# Patient Record
Sex: Male | Born: 1950 | Race: White | Hispanic: No | Smoking: Former smoker
Health system: Southern US, Community
[De-identification: ages and names within clinical notes are randomized; demographics above are authoritative.]

## PROBLEM LIST (undated history)

## (undated) DIAGNOSIS — J449 Chronic obstructive pulmonary disease, unspecified: Secondary | ICD-10-CM

## (undated) DIAGNOSIS — J309 Allergic rhinitis, unspecified: Secondary | ICD-10-CM

## (undated) DIAGNOSIS — J4489 Other specified chronic obstructive pulmonary disease: Secondary | ICD-10-CM

## (undated) DIAGNOSIS — I10 Essential (primary) hypertension: Secondary | ICD-10-CM

## (undated) DIAGNOSIS — K219 Gastro-esophageal reflux disease without esophagitis: Secondary | ICD-10-CM

## (undated) HISTORY — PX: TONSILLECTOMY: SUR1361

## (undated) HISTORY — DX: Gastro-esophageal reflux disease without esophagitis: K21.9

## (undated) HISTORY — DX: Other specified chronic obstructive pulmonary disease: J44.89

## (undated) HISTORY — DX: Allergic rhinitis, unspecified: J30.9

## (undated) HISTORY — DX: Chronic obstructive pulmonary disease, unspecified: J44.9

## (undated) HISTORY — DX: Essential (primary) hypertension: I10

## (undated) HISTORY — PX: BILATERAL VATS ABLATION: SHX1224

## (undated) HISTORY — PX: APPENDECTOMY: SHX54

---

## 2005-05-25 ENCOUNTER — Ambulatory Visit: Payer: Self-pay | Admitting: Internal Medicine

## 2005-06-02 ENCOUNTER — Ambulatory Visit: Payer: Self-pay | Admitting: Internal Medicine

## 2005-06-07 ENCOUNTER — Ambulatory Visit: Payer: Self-pay | Admitting: *Deleted

## 2005-07-24 ENCOUNTER — Ambulatory Visit: Payer: Self-pay | Admitting: Internal Medicine

## 2005-07-25 ENCOUNTER — Ambulatory Visit: Payer: Self-pay | Admitting: Internal Medicine

## 2006-03-13 ENCOUNTER — Ambulatory Visit: Payer: Self-pay | Admitting: Family Medicine

## 2006-03-13 ENCOUNTER — Ambulatory Visit (HOSPITAL_COMMUNITY): Admission: RE | Admit: 2006-03-13 | Discharge: 2006-03-13 | Payer: Self-pay | Admitting: Family Medicine

## 2006-04-09 ENCOUNTER — Ambulatory Visit: Payer: Self-pay | Admitting: Family Medicine

## 2006-04-10 ENCOUNTER — Ambulatory Visit (HOSPITAL_COMMUNITY): Admission: RE | Admit: 2006-04-10 | Discharge: 2006-04-10 | Payer: Self-pay | Admitting: Family Medicine

## 2006-04-11 ENCOUNTER — Ambulatory Visit: Payer: Self-pay | Admitting: Internal Medicine

## 2006-04-23 ENCOUNTER — Ambulatory Visit: Payer: Self-pay | Admitting: Family Medicine

## 2006-04-30 ENCOUNTER — Ambulatory Visit (HOSPITAL_COMMUNITY): Admission: RE | Admit: 2006-04-30 | Discharge: 2006-04-30 | Payer: Self-pay | Admitting: Family Medicine

## 2006-05-16 ENCOUNTER — Ambulatory Visit: Payer: Self-pay | Admitting: Family Medicine

## 2006-06-19 ENCOUNTER — Ambulatory Visit: Admission: RE | Admit: 2006-06-19 | Discharge: 2006-08-30 | Payer: Self-pay | Admitting: Radiation Oncology

## 2006-07-03 ENCOUNTER — Ambulatory Visit: Payer: Self-pay | Admitting: Thoracic Surgery

## 2006-07-03 ENCOUNTER — Encounter (INDEPENDENT_AMBULATORY_CARE_PROVIDER_SITE_OTHER): Payer: Self-pay | Admitting: Specialist

## 2006-07-03 ENCOUNTER — Inpatient Hospital Stay (HOSPITAL_COMMUNITY): Admission: RE | Admit: 2006-07-03 | Discharge: 2006-07-21 | Payer: Self-pay | Admitting: Thoracic Surgery

## 2006-07-03 ENCOUNTER — Ambulatory Visit: Payer: Self-pay | Admitting: Pulmonary Disease

## 2006-07-12 ENCOUNTER — Encounter (INDEPENDENT_AMBULATORY_CARE_PROVIDER_SITE_OTHER): Payer: Self-pay | Admitting: Specialist

## 2006-07-25 ENCOUNTER — Encounter: Admission: RE | Admit: 2006-07-25 | Discharge: 2006-07-25 | Payer: Self-pay | Admitting: Thoracic Surgery

## 2006-07-25 ENCOUNTER — Ambulatory Visit: Payer: Self-pay | Admitting: Thoracic Surgery

## 2006-07-26 ENCOUNTER — Ambulatory Visit: Payer: Self-pay | Admitting: Family Medicine

## 2006-08-01 ENCOUNTER — Encounter: Admission: RE | Admit: 2006-08-01 | Discharge: 2006-08-01 | Payer: Self-pay | Admitting: Thoracic Surgery

## 2006-08-01 ENCOUNTER — Ambulatory Visit: Payer: Self-pay | Admitting: Thoracic Surgery

## 2006-08-15 ENCOUNTER — Encounter: Admission: RE | Admit: 2006-08-15 | Discharge: 2006-08-15 | Payer: Self-pay | Admitting: Thoracic Surgery

## 2006-08-23 ENCOUNTER — Ambulatory Visit: Payer: Self-pay | Admitting: Family Medicine

## 2006-08-29 ENCOUNTER — Encounter: Admission: RE | Admit: 2006-08-29 | Discharge: 2006-08-29 | Payer: Self-pay | Admitting: Thoracic Surgery

## 2006-08-29 ENCOUNTER — Ambulatory Visit: Payer: Self-pay | Admitting: Thoracic Surgery

## 2006-10-11 ENCOUNTER — Ambulatory Visit: Payer: Self-pay | Admitting: Family Medicine

## 2006-10-17 ENCOUNTER — Ambulatory Visit: Payer: Self-pay | Admitting: Thoracic Surgery

## 2006-10-17 ENCOUNTER — Encounter: Admission: RE | Admit: 2006-10-17 | Discharge: 2006-10-17 | Payer: Self-pay | Admitting: Thoracic Surgery

## 2006-11-20 ENCOUNTER — Ambulatory Visit: Payer: Self-pay | Admitting: Thoracic Surgery

## 2006-11-20 ENCOUNTER — Encounter: Admission: RE | Admit: 2006-11-20 | Discharge: 2006-11-20 | Payer: Self-pay | Admitting: Thoracic Surgery

## 2007-01-01 ENCOUNTER — Ambulatory Visit: Payer: Self-pay | Admitting: Thoracic Surgery

## 2007-01-01 ENCOUNTER — Encounter: Admission: RE | Admit: 2007-01-01 | Discharge: 2007-01-01 | Payer: Self-pay | Admitting: Thoracic Surgery

## 2007-02-13 ENCOUNTER — Encounter (INDEPENDENT_AMBULATORY_CARE_PROVIDER_SITE_OTHER): Payer: Self-pay | Admitting: *Deleted

## 2007-02-26 ENCOUNTER — Encounter: Admission: RE | Admit: 2007-02-26 | Discharge: 2007-02-26 | Payer: Self-pay | Admitting: Thoracic Surgery

## 2007-02-26 ENCOUNTER — Ambulatory Visit: Payer: Self-pay | Admitting: Thoracic Surgery

## 2007-03-20 ENCOUNTER — Ambulatory Visit: Payer: Self-pay | Admitting: Family Medicine

## 2007-03-27 ENCOUNTER — Ambulatory Visit (HOSPITAL_COMMUNITY): Admission: RE | Admit: 2007-03-27 | Discharge: 2007-03-27 | Payer: Self-pay | Admitting: Family Medicine

## 2007-04-23 ENCOUNTER — Encounter: Admission: RE | Admit: 2007-04-23 | Discharge: 2007-04-23 | Payer: Self-pay | Admitting: Thoracic Surgery

## 2007-04-23 ENCOUNTER — Ambulatory Visit: Payer: Self-pay | Admitting: Thoracic Surgery

## 2007-05-15 ENCOUNTER — Ambulatory Visit: Payer: Self-pay | Admitting: Critical Care Medicine

## 2007-05-15 DIAGNOSIS — I10 Essential (primary) hypertension: Secondary | ICD-10-CM | POA: Insufficient documentation

## 2007-05-15 DIAGNOSIS — J209 Acute bronchitis, unspecified: Secondary | ICD-10-CM

## 2007-05-15 DIAGNOSIS — K219 Gastro-esophageal reflux disease without esophagitis: Secondary | ICD-10-CM

## 2007-05-15 DIAGNOSIS — J309 Allergic rhinitis, unspecified: Secondary | ICD-10-CM | POA: Insufficient documentation

## 2007-05-15 DIAGNOSIS — G8922 Chronic post-thoracotomy pain: Secondary | ICD-10-CM | POA: Insufficient documentation

## 2007-05-15 DIAGNOSIS — J441 Chronic obstructive pulmonary disease with (acute) exacerbation: Secondary | ICD-10-CM

## 2007-05-22 ENCOUNTER — Encounter: Payer: Self-pay | Admitting: Critical Care Medicine

## 2007-06-03 ENCOUNTER — Telehealth: Payer: Self-pay | Admitting: Critical Care Medicine

## 2007-06-03 ENCOUNTER — Encounter: Payer: Self-pay | Admitting: Critical Care Medicine

## 2007-06-19 ENCOUNTER — Encounter: Payer: Self-pay | Admitting: Critical Care Medicine

## 2007-06-24 ENCOUNTER — Ambulatory Visit: Payer: Self-pay | Admitting: Critical Care Medicine

## 2007-08-08 ENCOUNTER — Ambulatory Visit: Payer: Self-pay | Admitting: Critical Care Medicine

## 2008-04-28 ENCOUNTER — Ambulatory Visit: Payer: Self-pay | Admitting: Critical Care Medicine

## 2008-05-11 ENCOUNTER — Observation Stay (HOSPITAL_COMMUNITY): Admission: EM | Admit: 2008-05-11 | Discharge: 2008-05-12 | Payer: Self-pay | Admitting: Emergency Medicine

## 2008-07-01 ENCOUNTER — Ambulatory Visit: Payer: Self-pay | Admitting: Critical Care Medicine

## 2008-07-01 ENCOUNTER — Encounter: Payer: Self-pay | Admitting: Critical Care Medicine

## 2008-07-01 DIAGNOSIS — R1013 Epigastric pain: Secondary | ICD-10-CM | POA: Insufficient documentation

## 2008-07-02 ENCOUNTER — Ambulatory Visit: Payer: Self-pay | Admitting: Critical Care Medicine

## 2008-07-02 ENCOUNTER — Telehealth: Payer: Self-pay | Admitting: Critical Care Medicine

## 2008-08-03 ENCOUNTER — Ambulatory Visit: Payer: Self-pay | Admitting: Critical Care Medicine

## 2008-08-05 ENCOUNTER — Ambulatory Visit: Payer: Self-pay | Admitting: Cardiovascular Disease

## 2008-08-05 ENCOUNTER — Encounter: Payer: Self-pay | Admitting: Critical Care Medicine

## 2008-08-20 ENCOUNTER — Telehealth (INDEPENDENT_AMBULATORY_CARE_PROVIDER_SITE_OTHER): Payer: Self-pay | Admitting: *Deleted

## 2008-08-31 ENCOUNTER — Encounter: Payer: Self-pay | Admitting: Critical Care Medicine

## 2008-09-16 ENCOUNTER — Ambulatory Visit: Payer: Self-pay | Admitting: Critical Care Medicine

## 2008-12-23 ENCOUNTER — Ambulatory Visit: Payer: Self-pay | Admitting: Critical Care Medicine

## 2009-01-20 IMAGING — CR DG CHEST 2V
2 series · 2 of 2 positions shown · non-contrast
Comparison: 07/18/06.

CLINICAL DATA: 55-year-old with lung lesion.  
 CHEST - 2 VIEW:

[w chest pa]
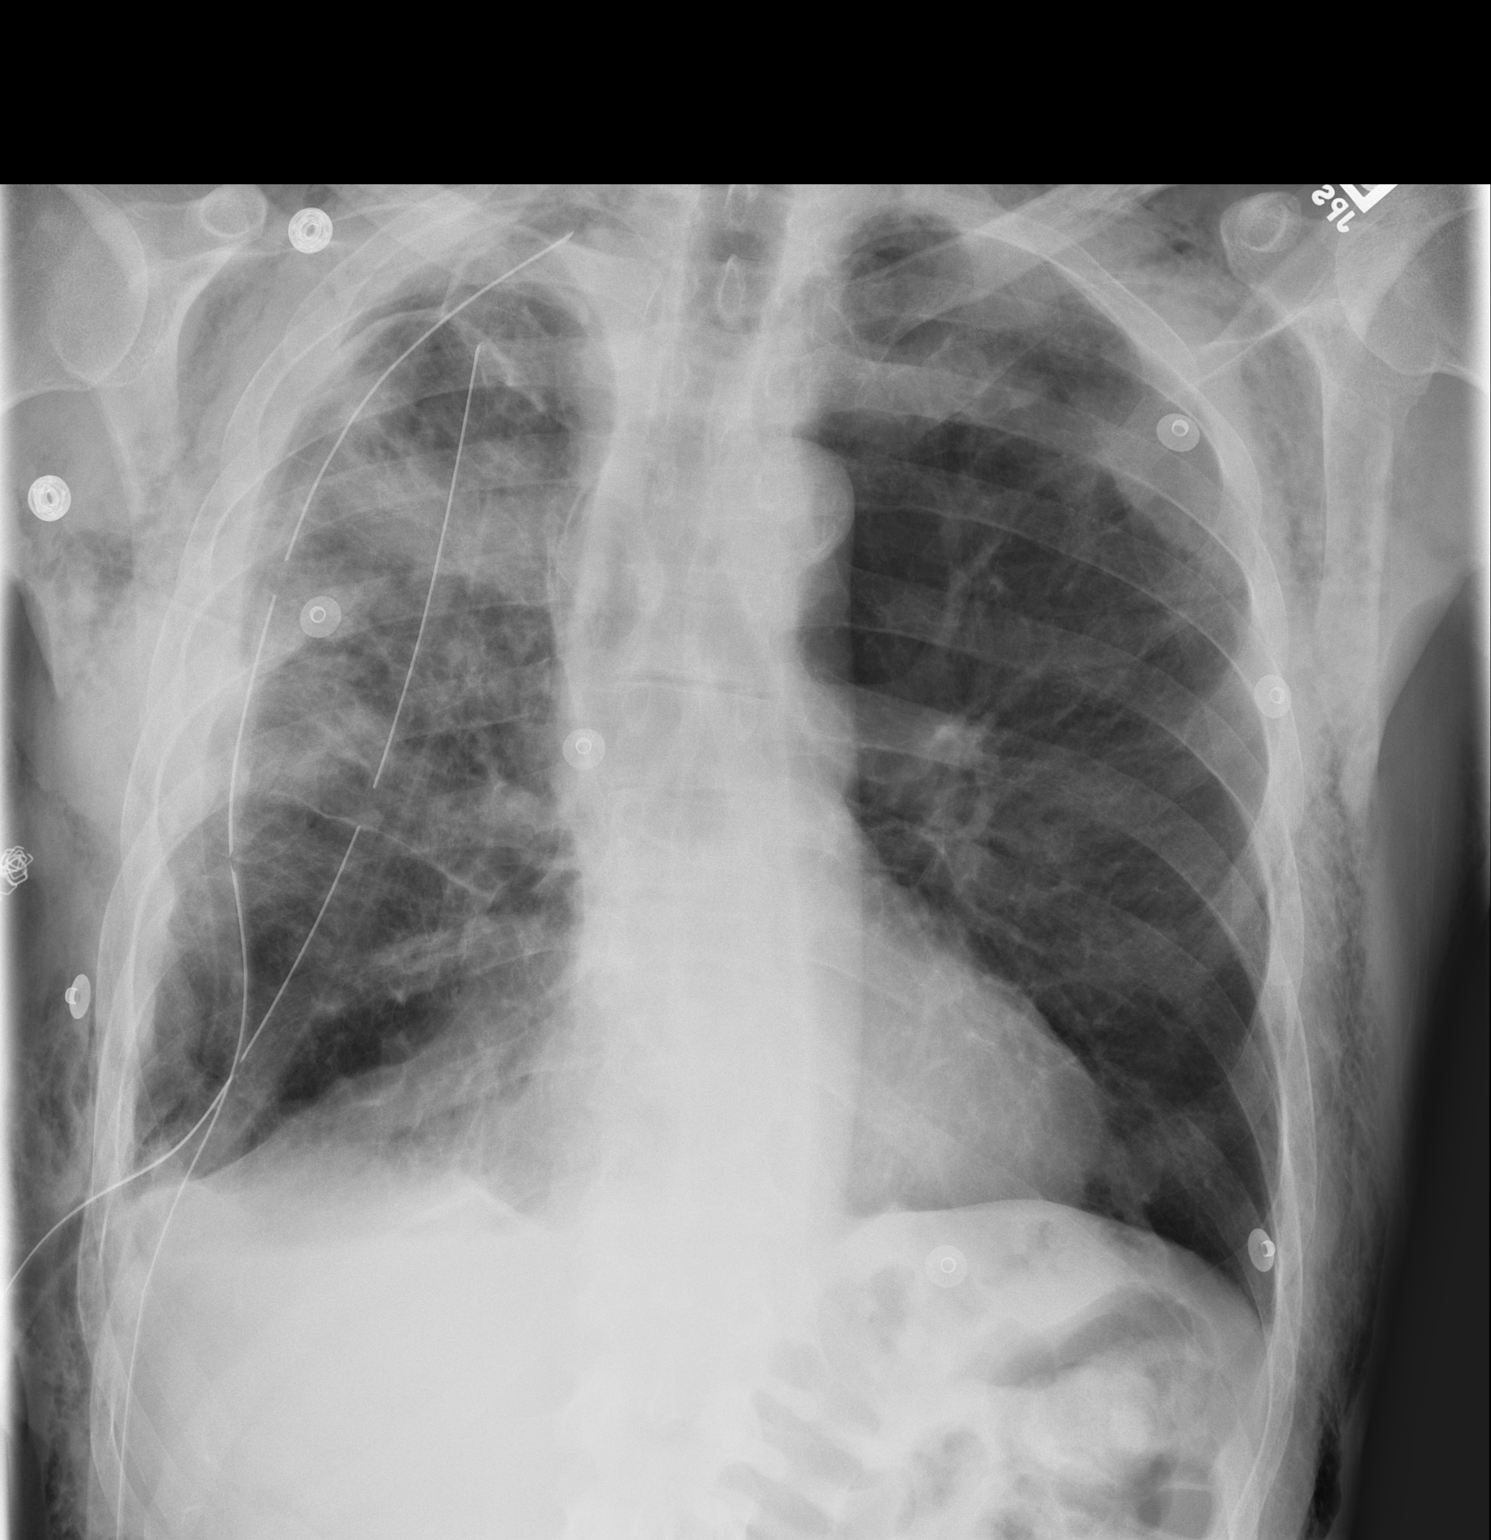

[w chest lat]
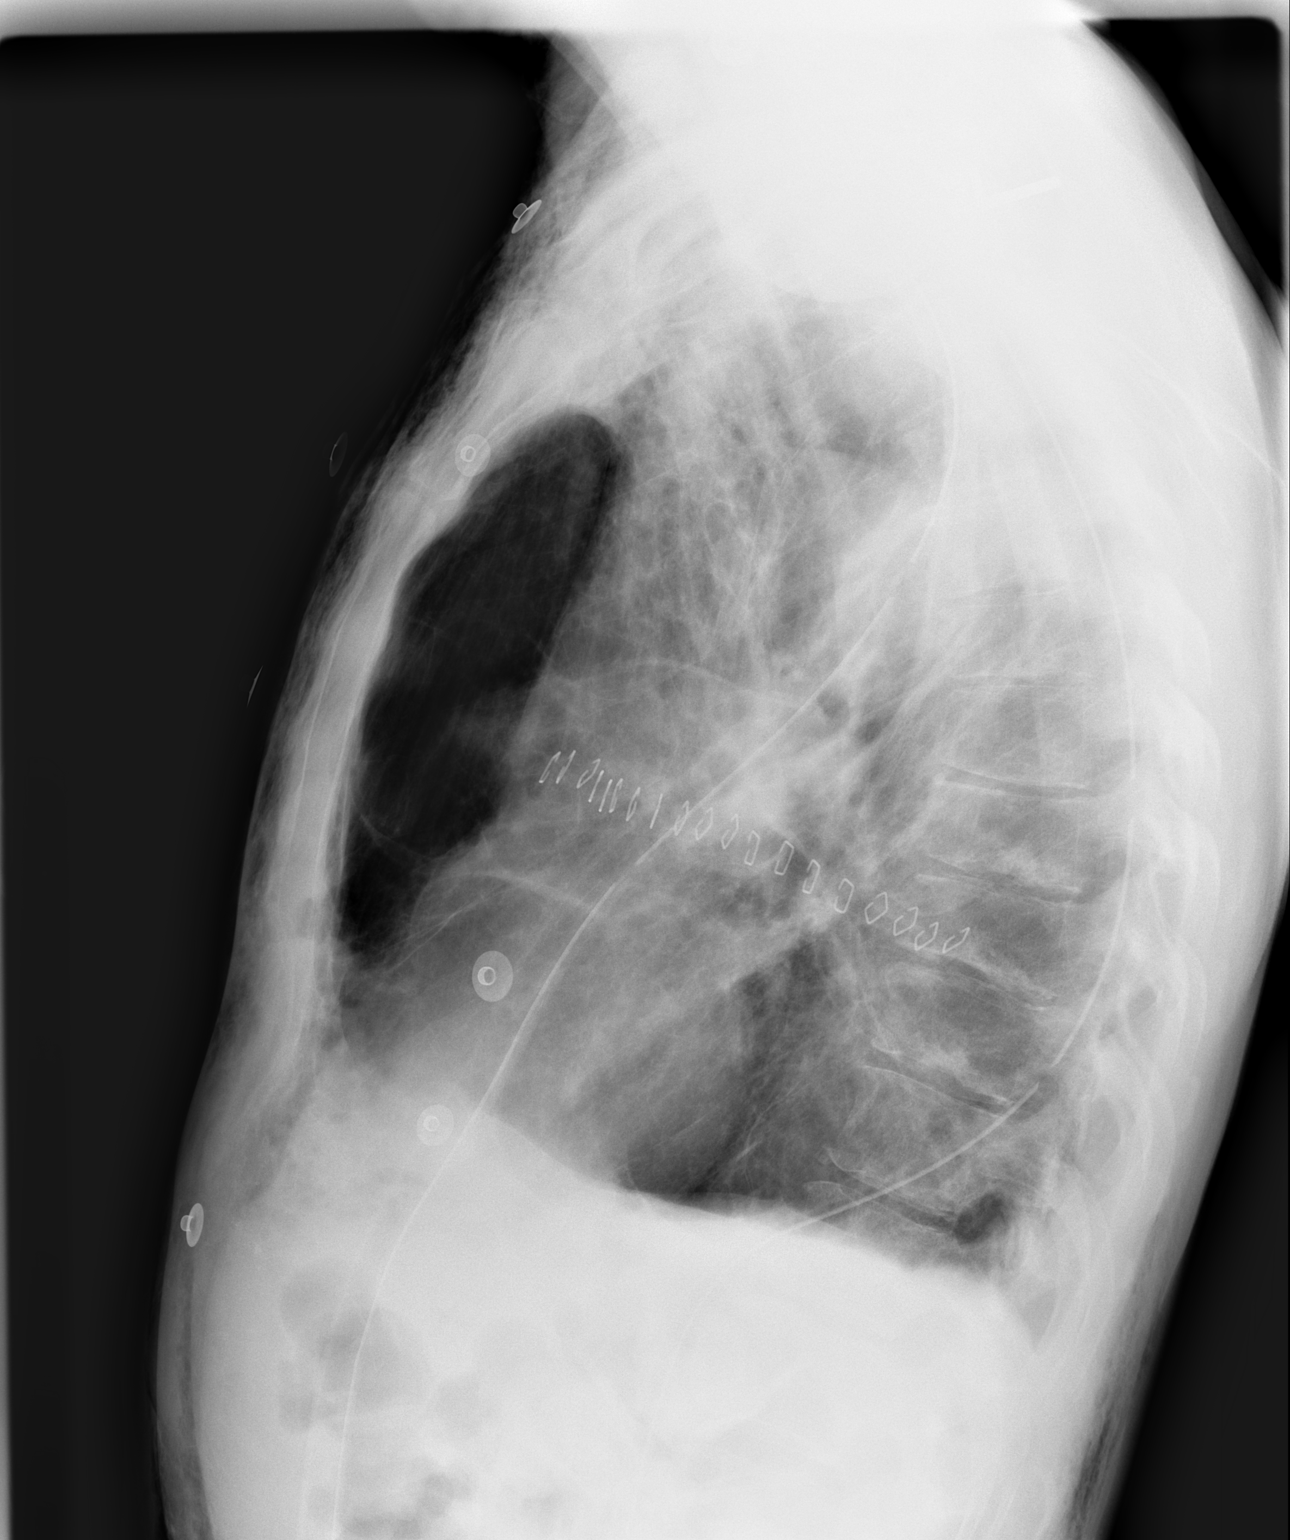

[2 of 2 positions shown; findings below may reference images not displayed]

FINDINGS: The right IJ catheter has been removed.  Two right-sided chest tubes are stable.  There is a small right-sided pneumothorax.  The left lung remains relatively clear.  
 Stable subcutaneous emphysema.  Persistent atelectasis and postoperative changes in the right lung.
IMPRESSION: 1.  Two right-sided chest tubes are stable.  There is a small right-sided pneumothorax.
 2.  Stable subcutaneous emphysema. 
 3.  Removal of right IJ catheter.

## 2009-02-10 ENCOUNTER — Telehealth (INDEPENDENT_AMBULATORY_CARE_PROVIDER_SITE_OTHER): Payer: Self-pay | Admitting: *Deleted

## 2009-02-10 ENCOUNTER — Ambulatory Visit: Payer: Self-pay | Admitting: Critical Care Medicine

## 2009-02-12 ENCOUNTER — Encounter: Payer: Self-pay | Admitting: Critical Care Medicine

## 2009-03-10 ENCOUNTER — Encounter: Payer: Self-pay | Admitting: Critical Care Medicine

## 2009-03-11 ENCOUNTER — Encounter: Payer: Self-pay | Admitting: Critical Care Medicine

## 2009-04-01 ENCOUNTER — Telehealth: Payer: Self-pay | Admitting: Critical Care Medicine

## 2009-04-13 ENCOUNTER — Ambulatory Visit: Payer: Self-pay | Admitting: Critical Care Medicine

## 2009-04-15 ENCOUNTER — Encounter: Payer: Self-pay | Admitting: Critical Care Medicine

## 2009-05-24 IMAGING — CR DG CHEST 2V
2 series · 2 of 2 positions shown · non-contrast
Comparison: 10/17/06.

CLINICAL DATA: Right lung lesion with surgery in June 2006. 
 CHEST - 2 VIEW:

[w chest pa]
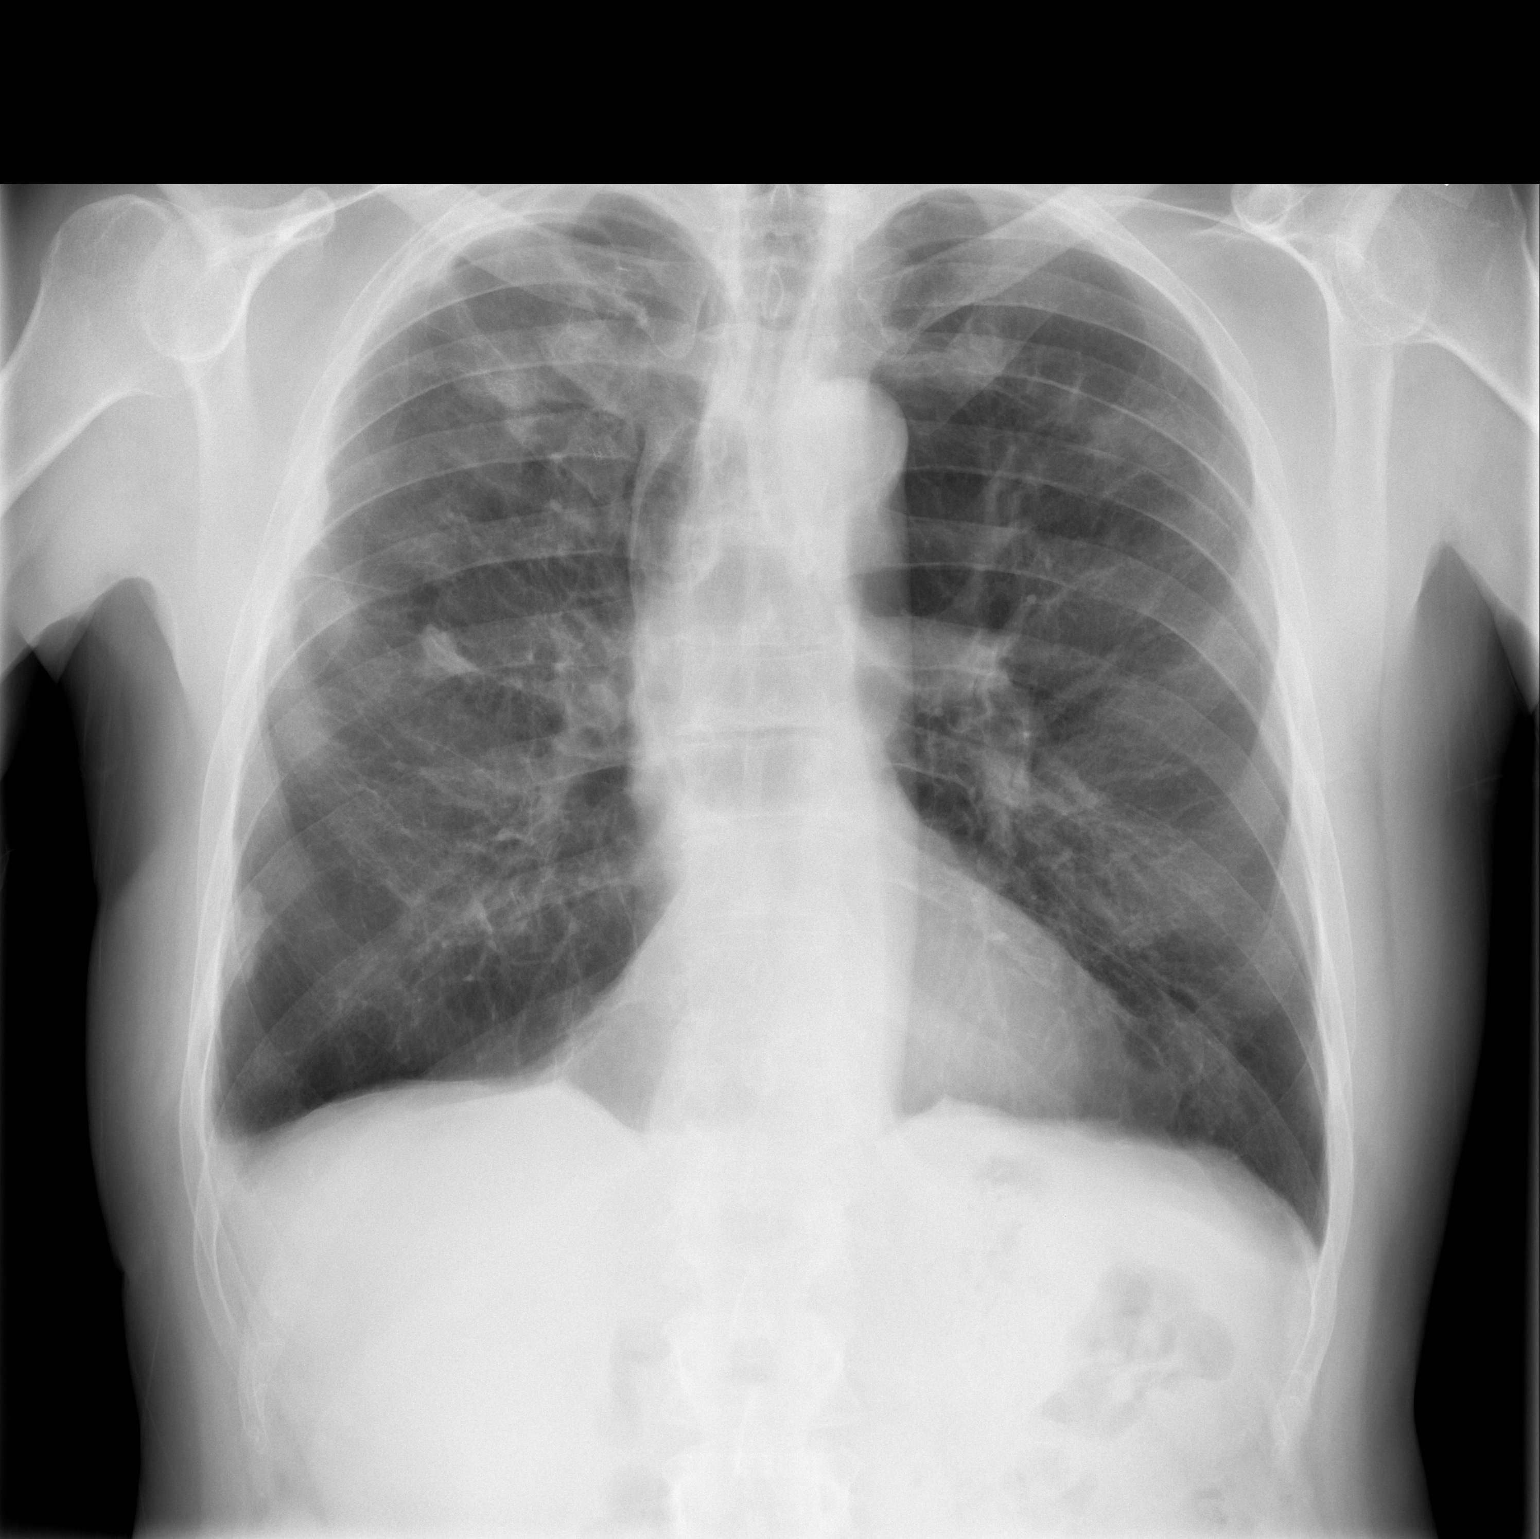

[w chest lat]
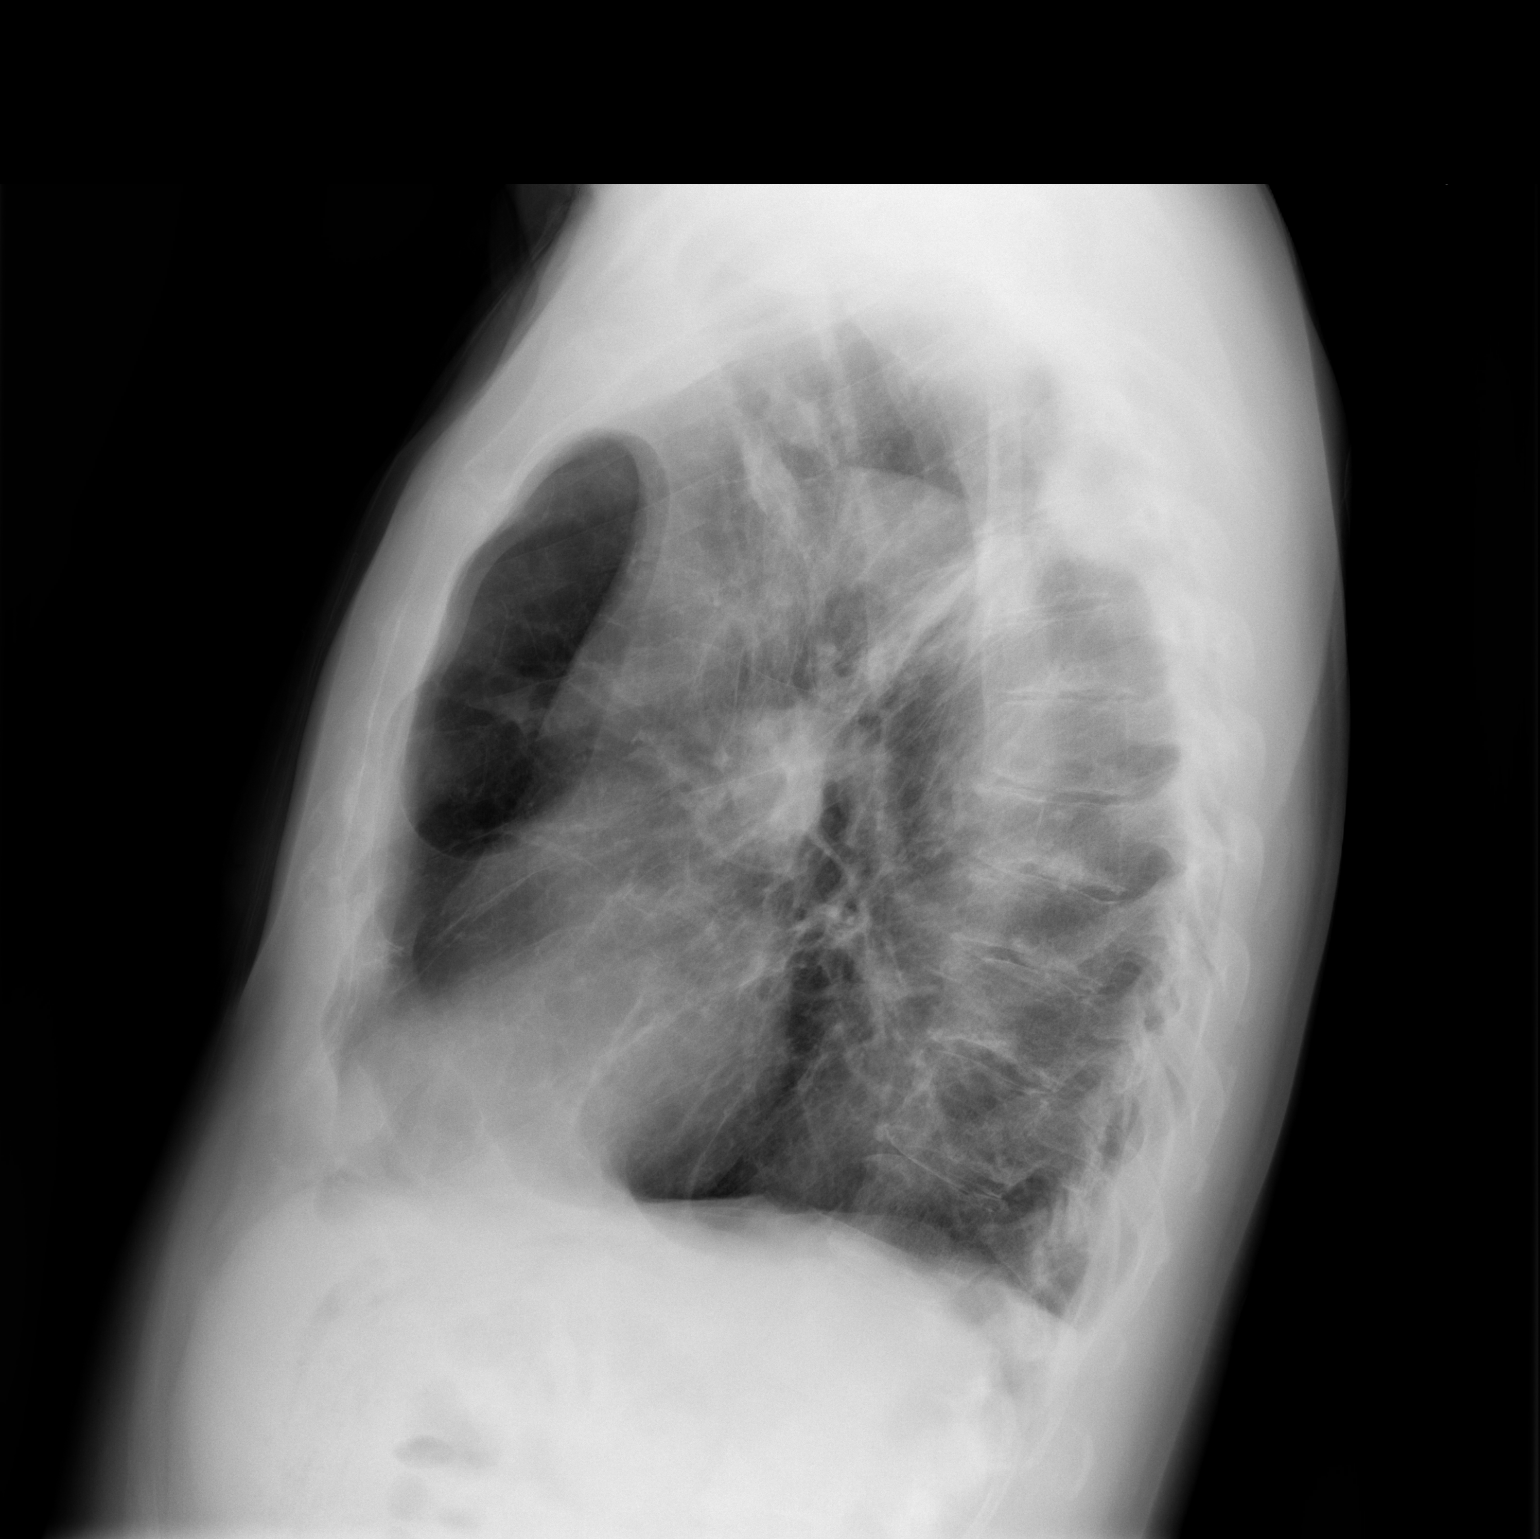

[2 of 2 positions shown; findings below may reference images not displayed]

FINDINGS: Patchy opacities within both lungs are unchanged in appearance compared with the prior film.  A small right hydropneumothorax and anterior mediastinal gas have not significantly changed.
IMPRESSION: No significant change in small right hydropneumothorax and mediastinal gas.

## 2009-05-31 ENCOUNTER — Telehealth: Payer: Self-pay | Admitting: Critical Care Medicine

## 2009-06-01 ENCOUNTER — Ambulatory Visit: Payer: Self-pay | Admitting: Critical Care Medicine

## 2009-06-02 ENCOUNTER — Telehealth (INDEPENDENT_AMBULATORY_CARE_PROVIDER_SITE_OTHER): Payer: Self-pay | Admitting: *Deleted

## 2009-06-22 ENCOUNTER — Encounter: Payer: Self-pay | Admitting: Critical Care Medicine

## 2009-07-19 ENCOUNTER — Ambulatory Visit: Payer: Self-pay | Admitting: Critical Care Medicine

## 2009-07-23 ENCOUNTER — Encounter: Payer: Self-pay | Admitting: Critical Care Medicine

## 2009-11-16 ENCOUNTER — Encounter: Payer: Self-pay | Admitting: Critical Care Medicine

## 2009-12-22 ENCOUNTER — Telehealth: Payer: Self-pay | Admitting: Critical Care Medicine

## 2010-04-13 ENCOUNTER — Ambulatory Visit: Payer: Self-pay | Admitting: Critical Care Medicine

## 2010-06-19 ENCOUNTER — Encounter: Payer: Self-pay | Admitting: Family Medicine

## 2010-06-19 ENCOUNTER — Encounter: Payer: Self-pay | Admitting: Thoracic Surgery

## 2010-06-28 NOTE — Assessment & Plan Note (Signed)
Summary: Pulmonary OV   Primary Provider/Referring Provider:  Ephriam Knuckles [facility], [place]  CC:  1 mo follow up.  states breathing has been a little worse over past month-having increased SOB with activity-using o2 more during the day and at night.  cough-productive with thick green mucus and body aches x2wks.  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     April 13, 2009 11:35 AM Symptoms the same as last ov. The pt notes same amount of heartburn on Dexilant as omeprazole. Pt still with mucous production. rx cefinir per PCP just started 11/11 for 10days pcp rx neurontin 300 three times a day for L arm pain with radiation of pain no pred was given  June 01, 2009 10:18 AM Pt is here for preop clearance for colonoscopy at last ov we rec: Stay on Symbicort and Spiriva Start Qvar 80 two puff twice daily  Prednisone 10mg  4 each am x 4 days, 3 x 4 days, 2 x 4 days, 1 x 4 days then stop Finish Antibiotic from primary care MD Now :  still dyspneic at times, pain in shoulder and burning on R side no real mucous for past few days, did cough up green in the past [facility] MD in [place]>>>>  [phone]   fax [phone] July 19, 2009 9:56 AM Still coughing thick green.  Still with dyspnea.  Had the colonoscopy.  Had hernia on EGD,  colonoscopy.  When swallow will not go down.  Liked nebulizer better.  Pt denies any significant sore throat, nasal congestion or excess secretions, fever, chills, sweats, unintended weight loss, pleurtic or exertional chest pain, orthopnea PND, or leg swelling Pt denies any increase in rescue therapy over baseline, denies waking up needing it or having any early am or nocturnal exacerbations of coughing/wheezing/or dyspnea.   Preventive  Screening-Counseling & Management  Alcohol-Tobacco     Smoking Status: quit > 6 months  Current Medications (verified): 1)  Flonase 50 Mcg/act  Susp (Fluticasone Propionate) .Marland Kitchen.. 1  Sprays Twice Daily Each Nostril 2)  Cozaar 100 Mg Tabs (Losartan Potassium) .... Take 1 Tab Once Daily 3)  Omeprazole 20 Mg  Cpdr (Omeprazole) .Marland Kitchen.. 1 By Mouth Twice A Day. Take One Half Hour Before Eating. 4)  Uroxatral 10 Mg  Tb24 (Alfuzosin Hcl) .Marland Kitchen.. 1 By Mouth Daily 5)  Oxygen .... 2l Bedtime and As Needed 6)  Singulair 10 Mg Tabs (Montelukast Sodium) .Marland Kitchen.. 1 Once Daily 7)  Wellbutrin Sr 150 Mg Xr12h-Tab (Bupropion Hcl) .... 2 Once Daily 8)  Restoril 30 Mg Caps (Temazepam) .Marland Kitchen.. 1 By Mouth At Bedtime 9)  Trazodone Hcl 50 Mg Tabs (Trazodone Hcl) .... At Bedtime 10)  Aerochamber Mv  Misc (Spacer/aero-Holding Chambers) .... Use As Directed 11)  Hydrocodone-Acetaminophen 5-500 Mg Tabs (Hydrocodone-Acetaminophen) .... As Needed 12)  Cyclobenzaprine Hcl 10 Mg Tabs (Cyclobenzaprine Hcl) .... As Needed 13)  Ventolin Hfa 108 (90 Base) Mcg/act Aers (Albuterol Sulfate) .... As Needed 14)  Gabapentin 300 Mg Caps (Gabapentin) .... Three Times A Day 15)  Qvar 80 Mcg/act  Aers (Beclomethasone Dipropionate) .... Two  Puffs Twice Daily 16)  Spiriva Handihaler 18 Mcg  Caps (Tiotropium Bromide Monohydrate) .... Two Puffs in Handihaler Daily 17)  Baclofen 10 Mg Tabs (Baclofen) .... As Directed 18)  Voltaren 1 % Gel (Diclofenac Sodium) .Marland KitchenMarland KitchenMarland Kitchen  As Directed  Allergies (verified): No Known Drug Allergies  Past History:  Past medical, surgical, family and social histories (including risk factors) reviewed, and no changes noted (except as noted below).  Past Medical History: Reviewed history from 07/04/2008 and no changes required. 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 s/p VATS 06/2006  per Dr Edwyna Shell:   Linnell Fulling lung DZ  RUL Appendectomy Tonsillectomy  Family History: Reviewed history from 05/15/2007 and no changes required. dystonia Emphysema:   Social History: Reviewed history from 05/15/2007 and no changes required. 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   Review of Systems       The patient complains of shortness of breath with activity, productive cough, and change in color of mucus.  The patient denies shortness of breath at rest, non-productive cough, coughing up blood, chest pain, irregular heartbeats, acid heartburn, indigestion, loss of appetite, weight change, abdominal pain, difficulty swallowing, sore throat, tooth/dental problems, headaches, nasal congestion/difficulty breathing through nose, sneezing, itching, ear ache, anxiety, depression, hand/feet swelling, joint stiffness or pain, rash, and fever.    Vital Signs:  Patient profile:   60 year old male Height:      69 inches Weight:      195.25 pounds BMI:     28.94 O2 Sat:      96 % on Room air Temp:     97.8 degrees F oral Pulse rate:   89 / minute BP sitting:   150 / 82  (right arm) Cuff size:   regular  Vitals Entered By: Gweneth Dimitri RN (July 19, 2009 9:47 AM)  O2 Flow:  Room air CC: 1 mo follow up.  states breathing has been a little worse over past month-having increased SOB with activity-using o2 more during the day and at night.  cough-productive with thick green mucus and body aches x2wks.  would like to discuss getting nebulizar. Comments Medications reviewed with patient Daytime contact number verified with patient. Gweneth Dimitri RN  July 19, 2009 9:47 AM    Physical Exam  Additional Exam:  Gen: Pleasant, well nourished, in no distress ENT: no lesions, no postnasal drip Neck: No JVD, no TMG, no carotid  bruits Lungs: no use of accessory muscles, no dullness to percussion, distant BS, improved airflow  Cardiovascular: RRR, heart sounds normal, no murmurs or gallops, no peripheral edema Musculoskeletal: No deformities, no cyanosis or clubbing     Impression & Recommendations:  Problem # 1:  EMPHYSEMA (ICD-492.8) Assessment Unchanged COPD moderate with poor DPI technique plan Stop Qvar Start Brovana and budesonide in nebulizer twice daily A nebulizer will be obtained from [facility] Stay on Spiriva daily Return 2 months  Orders: Est. Patient Level IV ([address]) DME Referral (DME)  Medications Added to Medication List This Visit: 1)  Cyclobenzaprine Hcl 10 Mg Tabs (Cyclobenzaprine hcl) .... Hold for now 2)  Brovana 15 Mcg/52ml Nebu (Arformoterol tartrate) .... One in nebulizer twice daily 3)  Baclofen 10 Mg Tabs (Baclofen) .... As directed 4)  Voltaren 1 % Gel (Diclofenac sodium) .... As directed 5)  Budesonide 0.25 Mg/41ml Susp (Budesonide) .... One in nebulizer two times a day 6)  Nebulizer  .... Use with brovana and budesonide  Complete Medication List: 1)  Flonase 50 Mcg/act Susp (Fluticasone propionate) .Marland Kitchen.. 1  sprays twice daily each nostril 2)  Cozaar 100 Mg Tabs (Losartan potassium) .... Take 1 tab once daily 3)  Omeprazole 20 Mg Cpdr (Omeprazole) .Marland Kitchen.. 1 by mouth twice a day. take one half hour before eating. 4)  Uroxatral 10 Mg Tb24 (Alfuzosin hcl) .Marland Kitchen.. 1 by mouth daily 5)  Oxygen  .... 2l bedtime and as needed 6)  Singulair 10 Mg Tabs (Montelukast sodium) .Marland Kitchen.. 1 once daily 7)  Wellbutrin Sr 150 Mg Xr12h-tab (Bupropion hcl) .... 2 once daily 8)  Restoril 30 Mg Caps (Temazepam) .Marland Kitchen.. 1 by mouth at bedtime 9)  Trazodone Hcl 50 Mg Tabs (Trazodone hcl) .... At bedtime 10)  Aerochamber Mv Misc (Spacer/aero-holding chambers) .... Use as directed 11)  Hydrocodone-acetaminophen 5-500 Mg Tabs (Hydrocodone-acetaminophen) .... As needed 12)  Cyclobenzaprine Hcl 10 Mg Tabs  (Cyclobenzaprine hcl) .... Hold for now 13)  Ventolin Hfa 108 (90 Base) Mcg/act Aers (Albuterol sulfate) .... As needed 14)  Gabapentin 300 Mg Caps (Gabapentin) .... Three times a day 15)  Brovana 15 Mcg/4ml Nebu (Arformoterol tartrate) .... One in nebulizer twice daily 16)  Spiriva Handihaler 18 Mcg Caps (Tiotropium bromide monohydrate) .... Two puffs in handihaler daily 17)  Baclofen 10 Mg Tabs (Baclofen) .... As directed 18)  Voltaren 1 % Gel (Diclofenac sodium) .... As directed 19)  Budesonide 0.25 Mg/43ml Susp (Budesonide) .... One in nebulizer two times a day 20)  Nebulizer  .... Use with brovana and budesonide  Patient Instructions: 1)  Stop Qvar 2)  Start Brovana and budesonide in nebulizer twice daily 3)  A nebulizer will be obtained from [facility] 4)  Stay on Spiriva daily 5)  Return 2 months  Prescriptions: NEBULIZER Use with brovana and budesonide  #1 x 0   Entered and Authorized by:   Storm Frisk MD   Signed by:   Storm Frisk MD on 07/19/2009   Method used:   Print then Give to Patient   RxID:   [id number] BUDESONIDE 0.25 MG/2ML SUSP (BUDESONIDE) One in nebulizer two times a day  #60 x 6   Entered and Authorized by:   Storm Frisk MD   Signed by:   Storm Frisk MD on 07/19/2009   Method used:   Print then Give to Patient   RxID:   [id number] BROVANA 15 MCG/2ML  NEBU (ARFORMOTEROL TARTRATE) One in nebulizer twice daily  #60 x 6   Entered and Authorized by:   Storm Frisk MD   Signed by:   Storm Frisk MD on 07/19/2009   Method used:   Print then Give to Patient   RxID:   [id number]    Immunization History:  Influenza Immunization History:    Influenza:  historical (02/26/2009)  Pneumovax Immunization History:    Pneumovax:  historical (05/30/2007)

## 2010-06-28 NOTE — Progress Notes (Signed)
Summary: prescript  Phone Note Call from Patient   Caller: [name] Call For: Nicholas Brady Summary of Call: pt want to go back on qvar also need prescript for ventolin hfa [facility] [phone] Initial call taken by: Rickard Patience,  December 22, 2009 11:03 AM  Follow-up for Phone Call        called and spoke with krista--pts wife---she stated that the pt is requesting to go back on the qvar--he stated that this works better for him than using the nebs--appt has been made for pt for 8-4--at 2pm.  pt also requesting refill of the ventolin.  please advise. thanks Randell Loop CMA  December 22, 2009 11:17 AM   Additional Follow-up for Phone Call Additional follow up Details #1::        this is ok the Qvar should be two puff two times a day ok for refill on ventolin hfa  Additional Follow-up by: Storm Frisk MD,  December 22, 2009 11:19 AM    Additional Follow-up for Phone Call Additional follow up Details #2::    called and spoke with krista---she is aware of meds that have been sent to pts pharmacy--will call for any other concerns. Randell Loop CMA  December 22, 2009 11:25 AM   New/Updated Medications: QVAR 80 MCG/ACT AERS (BECLOMETHASONE DIPROPIONATE) 2 puffs by mouth two times a day Prescriptions: QVAR 80 MCG/ACT AERS (BECLOMETHASONE DIPROPIONATE) 2 puffs by mouth two times a day  #1 x 1   Entered by:   Randell Loop CMA   Authorized by:   Storm Frisk MD   Signed by:   Randell Loop CMA on 12/22/2009   Method used:   Electronically to        [facility]  [address] (retail)       [address]       [place], [place]  [address]       Ph: [phone]       Fax: [phone]   RxID:   [id number] VENTOLIN HFA 108 (90 BASE) MCG/ACT AERS (ALBUTEROL SULFATE) as needed  #1 x 1   Entered by:   Randell Loop CMA   Authorized by:   Storm Frisk MD   Signed by:   Randell Loop CMA on 12/22/2009   Method used:   Electronically to        [facility] [address] (retail)       [address]       [place], [place]  [address]       Ph: [phone]       Fax: [phone]   RxID:   [id number]

## 2010-06-28 NOTE — Progress Notes (Signed)
Summary: talk to nurse  Phone Note Call from Patient Call back at Home Phone [phone]   Caller: Patient Call For: wright Reason for Call: Talk to Nurse Summary of Call: patient sceduled an apt to see dr Delford Field in feb. and he is also scheduled for a colonoscopy, dr Delford Field will need him to approve him for all of this. he will be under ansethia.  Initial call taken by: Valinda Hoar,  May 31, 2009 12:36 PM  Follow-up for Phone Call        Pt's girlfriend states pt is requesting clearance ASAP due to pt not being able to get scheduled for colonoscopy until pt is cleared by [facility]. Please advise. Zackery Barefoot CMA  May 31, 2009 2:08 PM   Additional Follow-up for Phone Call Additional follow up Details #1::        Pt will need office visit asap for preop clearance  pw Additional Follow-up by: Storm Frisk MD,  May 31, 2009 3:44 PM    Additional Follow-up for Phone Call Additional follow up Details #2::    Crystal where do you want this pt? Zackery Barefoot CMA  May 31, 2009 4:08 PM   there is an opening 1/4 at 9:50. if this does not work, please let me know and find out if pt is willing/abe to come to [facility] office. Thanks! Gweneth Dimitri RN  May 31, 2009 4:49 PM  Pt okay with 01/04 appointment and scheduled for same. Zackery Barefoot CMA  May 31, 2009 5:05 PM

## 2010-06-28 NOTE — Progress Notes (Signed)
Summary: Qvar refill  Phone Note Call from Patient Call back at Home Phone [phone]   Caller: Patient Call For: wright Reason for Call: Refill Medication Summary of Call: wanted to ask [facility] for a Qvar rx.  Pt forgot to ask for rx when here yesterday.  Please call in to [facility] [address] / [phone] Initial call taken by: Eugene Gavia,  June 02, 2009 4:15 PM  Follow-up for Phone Call        Patient is aware RX for Qvar sent to his pharmacy.Michel [facility]  June 02, 2009 4:22 PM    Prescriptions: QVAR 80 MCG/ACT  AERS (BECLOMETHASONE DIPROPIONATE) Two  puffs twice daily  #1 x 8   Entered by:   Michel Bickers CMA   Authorized by:   Storm Frisk MD   Signed by:   Michel Bickers CMA on 06/02/2009   Method used:   Electronically to        [facility] [address] [address] (retail)       [address]       [place], [place]  [address]       Ph: [phone]       Fax: [phone]   RxID:   [id number]

## 2010-06-28 NOTE — Letter (Signed)
Summary: [facility]  [facility]   Imported By: Lester Tolland 11/24/2009 10:17:34  _____________________________________________________________________  External Attachment:    Type:   Image     Comment:   External Document

## 2010-06-28 NOTE — Assessment & Plan Note (Signed)
Summary: Pulmonary OV   Primary Provider/Referring Provider:  Ephriam Knuckles [facility]  [place], [place]  CC:  Follow up.  Having increased SOB when rusing and walking up stairs  - worse x 1 month.  Prod cough with gray mucus..  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    April 13, 2010 2:34 PM In [place] over the summer.  Came back to [place] 1st of December. Pt now notes dyspnea and cough now worse.  There is heavy gray mucus.  The pt  notes more acid reflux.  There is more wheezing noted.   Pt denies any significant sore throat, nasal congestion or excess secretions, fever, chills, sweats, unintended weight loss, pleurtic or exertional chest pain, orthopnea PND, or leg swelling Pt denies any increase in rescue therapy over baseline, denies waking up needing it or having any early am or nocturnal exacerbations of coughing/wheezing/or dyspnea.     Preventive Screening-Counseling & Management  Alcohol-Tobacco     Smoking Status: quit > 6 months     Year Quit: 2008     Pack years: 41  Current Medications (verified): 1)  Flonase 50 Mcg/act  Susp (Fluticasone Propionate) .Marland Kitchen.. 1  Sprays Twice Daily Each Nostril 2)  Cozaar 100 Mg Tabs (Losartan Potassium) .... Take 1 Tab Once Daily 3)  Omeprazole 20 Mg  Cpdr (Omeprazole) .Marland Kitchen.. 1 By Mouth Twice A Day. Take One Half Hour Before Eating. 4)  Uroxatral 10 Mg  Tb24 (Alfuzosin Hcl) .Marland Kitchen.. 1 By Mouth Daily 5)  Singulair 10 Mg Tabs (Montelukast Sodium) .Marland Kitchen.. 1 Once Daily 6)  Wellbutrin Sr 150 Mg Xr12h-Tab (Bupropion Hcl) .... 2 Once Daily 7)  Restoril 30 Mg Caps (Temazepam) .Marland Kitchen.. 1 By Mouth At Bedtime 8)  Trazodone Hcl 50 Mg Tabs (Trazodone Hcl) .... Take 1 Tablet By Mouth Two Times A Day 9)  Aerochamber Mv  Misc (Spacer/aero-Holding Chambers) .... Use As Directed 10)  Oxycodone-Acetaminophen 7.5-500 Mg Tabs  (Oxycodone-Acetaminophen) .... Three Times A Day As Needed 11)  Cyclobenzaprine Hcl 10 Mg Tabs (Cyclobenzaprine Hcl) .... Take 1 Tablet By Mouth Three Times A Day 12)  Ventolin Hfa 108 (90 Base) Mcg/act Aers (Albuterol Sulfate) .... As Needed 13)  Gabapentin 300 Mg Caps (Gabapentin) .... Three Times A Day 14)  Qvar 80 Mcg/act Aers (Beclomethasone Dipropionate) .... 2 Puffs By Mouth Two Times A Day 15)  Spiriva Handihaler 18 Mcg  Caps (Tiotropium Bromide Monohydrate) .... Two Puffs in Handihaler Daily 16)  Voltaren 1 % Gel (Diclofenac Sodium) .... As Directed 17)  Budesonide 0.25 Mg/72ml Susp (Budesonide) .... One in Nebulizer Two Times A Day 18)  Nebulizer .... Use With Brovana and Budesonide 19)  Symbicort 160-4.5 Mcg/act Aero (Budesonide-Formoterol Fumarate) .... Inhale 2 Puffs Two Times A Day 20)  Oxygen .... 2l At Bedtime 21)  Brovana 15 Mcg/71ml Nebu (Arformoterol Tartrate) .... Two Times A Day  Allergies (verified): No Known Drug Allergies  Past History:  Past medical, surgical, family and social histories (including risk factors) reviewed, and no changes noted (except as noted below).  Past Medical History: Reviewed history from 07/04/2008 and no changes required. Emphysema Hypertension Allergic Rhinitis G E R D Hx of RUL Granulomatous Dz      -s/p RUL resection 06/2006     -path : granulomatous inflammation     -All AFB/fungal cultures/stains negative  Past Surgical History: Reviewed history from  07/04/2008 and no changes required. s/p VATS 06/2006  per Dr Edwyna Shell:   Linnell Fulling lung DZ  RUL Appendectomy Tonsillectomy  Family History: Reviewed history from 05/15/2007 and no changes required. dystonia Emphysema:   Social History: Reviewed history from 05/15/2007 and no changes required. 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 Quit in 06/2006.  3-4 ppd x 30 yrs  Review of Systems       The patient complains of  shortness of breath with activity, shortness of breath at rest, and non-productive cough.  The patient denies productive cough, coughing up blood, chest pain, irregular heartbeats, acid heartburn, indigestion, loss of appetite, weight change, abdominal pain, difficulty swallowing, sore throat, tooth/dental problems, headaches, nasal congestion/difficulty breathing through nose, sneezing, itching, ear ache, anxiety, depression, hand/feet swelling, joint stiffness or pain, rash, change in color of mucus, and fever.    Vital Signs:  Patient profile:   60 year old male Height:      69 inches Weight:      195.25 pounds BMI:     28.94 O2 Sat:      94 % on Room air Temp:     98.2 degrees F oral Pulse rate:   96 / minute BP sitting:   144 / 100  (left arm) Cuff size:   regular  Vitals Entered By: Gweneth Dimitri RN (April 13, 2010 2:28 PM)  O2 Flow:  Room air CC: Follow up.  Having increased SOB when rusing and walking up stairs  - worse x 1 month.  Prod cough with gray mucus. Comments Medications reviewed with patient Daytime contact number verified with patient. Gweneth Dimitri RN  April 13, 2010 2:29 PM    Physical Exam  Additional Exam:  Gen: Pleasant, well nourished, in no distress ENT: no lesions, no postnasal drip Neck: No JVD, no TMG, no carotid bruits Lungs: no use of accessory muscles, no dullness to percussion, distant BS, Cardiovascular: RRR, heart sounds normal, no murmurs or gallops, no peripheral edema Musculoskeletal: No deformities, no cyanosis or clubbing     Impression & Recommendations:  Problem # 1:  BRONCHITIS, ACUTE WITH BRONCHOSPASM (ICD-466.0) Assessment Deteriorated Deteriorated copd due to misuse of HFAs and not using BDs via neb machine as rx plan resume LABA and ICS via BD neb machine d/c symbicort and qvar  pulse pred and doxycycline  The following medications were removed from the medication list:    Qvar 80 Mcg/act Aers (Beclomethasone  dipropionate) .Marland Kitchen... 2 puffs by mouth two times a day    Symbicort 160-4.5 Mcg/act Aero (Budesonide-formoterol fumarate) ..... Inhale 2 puffs two times a day His updated medication list for this problem includes:    Singulair 10 Mg Tabs (Montelukast sodium) .Marland Kitchen... 1 once daily    Ventolin Hfa 108 (90 Base) Mcg/act Aers (Albuterol sulfate) .Marland Kitchen... As needed 1-2 puffs every 4-6 hours    Spiriva Handihaler 18 Mcg Caps (Tiotropium bromide monohydrate) .Marland Kitchen..Marland Kitchen Two puffs in handihaler daily    Budesonide 0.25 Mg/81ml Susp (Budesonide) ..... One in nebulizer two times a day    Brovana 15 Mcg/43ml Nebu (Arformoterol tartrate) .Marland Kitchen..Marland Kitchen Two times a day    Doxycycline Monohydrate 100 Mg Caps (Doxycycline monohydrate) ..... By mouth twice daily  Orders: Est. Patient Level IV ([address])  Medications Added to Medication List This Visit: 1)  Trazodone Hcl 50 Mg Tabs (Trazodone hcl) .... Take 1 tablet by mouth two times a day 2)  Oxycodone-acetaminophen 7.5-500 Mg Tabs (Oxycodone-acetaminophen) .... Three times  a day as needed 3)  Cyclobenzaprine Hcl 10 Mg Tabs (Cyclobenzaprine hcl) .... Take 1 tablet by mouth three times a day 4)  Ventolin Hfa 108 (90 Base) Mcg/act Aers (Albuterol sulfate) .... As needed 1-2 puffs every 4-6 hours 5)  Oxygen  .... 2l at bedtime 6)  Brovana 15 Mcg/33ml Nebu (Arformoterol tartrate) .... Two times a day 7)  Prednisone 10 Mg Tabs (Prednisone) .... Take as directed 4 each am x3days, 3 x 3days, 2 x 3days, 1 x 3days then stop 8)  Doxycycline Monohydrate 100 Mg Caps (Doxycycline monohydrate) .... By mouth twice daily  Complete Medication List: 1)  Flonase 50 Mcg/act Susp (Fluticasone propionate) .Marland Kitchen.. 1  sprays twice daily each nostril 2)  Cozaar 100 Mg Tabs (Losartan potassium) .... Take 1 tab once daily 3)  Omeprazole 20 Mg Cpdr (Omeprazole) .Marland Kitchen.. 1 by mouth twice a day. take one half hour before eating. 4)  Uroxatral 10 Mg Tb24 (Alfuzosin hcl) .Marland Kitchen.. 1 by mouth daily 5)  Singulair 10 Mg Tabs  (Montelukast sodium) .Marland Kitchen.. 1 once daily 6)  Restoril 30 Mg Caps (Temazepam) .Marland Kitchen.. 1 by mouth at bedtime 7)  Trazodone Hcl 50 Mg Tabs (Trazodone hcl) .... Take 1 tablet by mouth two times a day 8)  Aerochamber Mv Misc (Spacer/aero-holding chambers) .... Use as directed 9)  Oxycodone-acetaminophen 7.5-500 Mg Tabs (Oxycodone-acetaminophen) .... Three times a day as needed 10)  Cyclobenzaprine Hcl 10 Mg Tabs (Cyclobenzaprine hcl) .... Take 1 tablet by mouth three times a day 11)  Ventolin Hfa 108 (90 Base) Mcg/act Aers (Albuterol sulfate) .... As needed 1-2 puffs every 4-6 hours 12)  Gabapentin 300 Mg Caps (Gabapentin) .... Three times a day 13)  Spiriva Handihaler 18 Mcg Caps (Tiotropium bromide monohydrate) .... Two puffs in handihaler daily 14)  Voltaren 1 % Gel (Diclofenac sodium) .... As directed 15)  Budesonide 0.25 Mg/73ml Susp (Budesonide) .... One in nebulizer two times a day 16)  Nebulizer  .... Use with brovana and budesonide 17)  Oxygen  .... 2l at bedtime 18)  Brovana 15 Mcg/65ml Nebu (Arformoterol tartrate) .... Two times a day 19)  Prednisone 10 Mg Tabs (Prednisone) .... Take as directed 4 each am x3days, 3 x 3days, 2 x 3days, 1 x 3days then stop 20)  Doxycycline Monohydrate 100 Mg Caps (Doxycycline monohydrate) .... By mouth twice daily  Patient Instructions: 1)  Doxycycline 100mg  two times a day x 7 days 2)  Prednisone 10mg  4 each am x3days, 3 x 3days, 2 x 3days, 1 x 3days then stop 3)  Stop Qvar and Symbicort 4)  USe ventolin as needed 5)  Stay on brovana and Budesonide together in nebulizer twice daily 6)  Return 4-5 months Prescriptions: SPIRIVA HANDIHALER 18 MCG  CAPS (TIOTROPIUM BROMIDE MONOHYDRATE) Two puffs in handihaler daily  #30 x 6   Entered and Authorized by:   Storm Frisk MD   Signed by:   Storm Frisk MD on 04/13/2010   Method used:   Electronically to        [facility] [address] [facility] (retail)       [address]       [place], [place]  [address]        Ph: [phone]       Fax: [phone]   RxID:   [id number] VENTOLIN HFA 108 (90 BASE) MCG/ACT AERS (ALBUTEROL SULFATE) as needed 1-2 puffs every 4-6 hours  #1 x 6   Entered and  Authorized by:   Storm Frisk MD   Signed by:   Storm Frisk MD on 04/13/2010   Method used:   Electronically to        [facility] [facility] (retail)       [address]       [place], [place]  [address]       Ph: [phone]       Fax: [phone]   RxID:   [id number] DOXYCYCLINE MONOHYDRATE 100 MG  CAPS (DOXYCYCLINE MONOHYDRATE) By mouth twice daily  #14 x 0   Entered and Authorized by:   Storm Frisk MD   Signed by:   Storm Frisk MD on 04/13/2010   Method used:   Electronically to        [facility] [address] [facility] (retail)       [address]       [place], [place]  [address]       Ph: [phone]       Fax: [phone]   RxID:   [id number] PREDNISONE 10 MG  TABS (PREDNISONE) Take as directed 4 each am x3days, 3 x 3days, 2 x 3days, 1 x 3days then stop  #30 x 0   Entered and Authorized by:   Storm Frisk MD   Signed by:   Storm Frisk MD on 04/13/2010   Method used:   Electronically to        [facility] [facility] (retail)       [address]       [place], [place]  [address]       Ph: [phone]       Fax: [phone]   RxID:   [id number]    Immunization History:  Influenza Immunization History:    Influenza:  historical (02/26/2010)   Prevention & Chronic Care Immunizations   Influenza vaccine: Historical  (02/26/2010)    Tetanus booster: Not documented    Pneumococcal vaccine: Historical  (05/30/2007)    H. zoster vaccine: Not documented  Colorectal Screening   Hemoccult: Not documented    Colonoscopy: Not documented  Other Screening   PSA: Not documented   Smoking status: quit > 6 months  (04/13/2010)  Lipids   Total Cholesterol: Not documented   LDL: Not documented   LDL Direct: Not documented   HDL: Not documented    Triglycerides: Not documented  Hypertension   Last Blood Pressure: 144 / 100  (04/13/2010)   Serum creatinine: Not documented   Serum potassium Not documented  Self-Management Support :    Hypertension self-management support: Not documented  Appended Document: Pulmonary OV fax jennifer chapman

## 2010-06-28 NOTE — Procedures (Signed)
Summary: Upper Endoscopy & Colonoscopy/[place] [facility].  Upper Endoscopy & Colonoscopy/[place] [facility].   Imported By: Sherian Rein 06/30/2009 07:45:21  _____________________________________________________________________  External Attachment:    Type:   Image     Comment:   External Document

## 2010-06-28 NOTE — Medication Information (Signed)
[facility]   Imported By: Lester [place] 07/29/2009 10:42:26  _____________________________________________________________________  External Attachment:    Type:   Image     Comment:   External Document

## 2010-06-28 NOTE — Assessment & Plan Note (Signed)
Summary: Pulmonary OV   Primary Provider/Referring Provider:  Ephriam Knuckles [facility]  [place], [place]  CC:  Follow up for colonoscopy clearance by Dr. Doristine Church in [place].  having good days and bad days.  using o2 more than usual with activity. .  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     April 13, 2009 11:35 AM Symptoms the same as last ov. The pt notes same amount of heartburn on Dexilant as omeprazole. Pt still with mucous production. rx cefinir per PCP just started 11/11 for 10days pcp rx neurontin 300 three times a day for L arm pain with radiation of pain no pred was given  June 01, 2009 10:18 AM Pt is here for preop clearance for colonoscopy at last ov we rec: Stay on Symbicort and Spiriva Start Qvar 80 two puff twice daily  Prednisone 10mg  4 each am x 4 days, 3 x 4 days, 2 x 4 days, 1 x 4 days then stop Finish Antibiotic from primary care MD Now :  still dyspneic at times, pain in shoulder and burning on R side no real mucous for past few days, did cough up green in the past william blackard GI MD in [place]>>>>  [phone]   fax [phone]   Preventive Screening-Counseling & Management  Alcohol-Tobacco     Smoking Status: quit > 6 months  Current Medications (verified): 1)  Symbicort 160-4.5 Mcg/act  Aero (Budesonide-Formoterol Fumarate) .... Two Puffs Twice Daily 2)  Flonase 50 Mcg/act  Susp (Fluticasone Propionate) .Marland Kitchen.. 1  Sprays Twice Daily Each Nostril 3)  Cozaar 100 Mg Tabs (Losartan Potassium) .... Take 1 Tab Once Daily 4)  Omeprazole 20 Mg  Cpdr (Omeprazole) .Marland Kitchen.. 1 By Mouth Twice A Day. Take One Half Hour Before Eating. 5)  Uroxatral 10 Mg  Tb24 (Alfuzosin Hcl) .Marland Kitchen.. 1 By Mouth Daily 6)  Oxygen .... 2l Bedtime and As Needed 7)  Singulair 10 Mg Tabs (Montelukast Sodium) .Marland Kitchen.. 1 Once Daily 8)   Wellbutrin Sr 150 Mg Xr12h-Tab (Bupropion Hcl) .... 2 Once Daily 9)  Restoril 30 Mg Caps (Temazepam) .Marland Kitchen.. 1 By Mouth At Bedtime 10)  Trazodone Hcl 50 Mg Tabs (Trazodone Hcl) .... At Bedtime 11)  Aerochamber Mv  Misc (Spacer/aero-Holding Chambers) .... Use As Directed 12)  Hydrocodone-Acetaminophen 5-500 Mg Tabs (Hydrocodone-Acetaminophen) .... As Needed 13)  Cyclobenzaprine Hcl 10 Mg Tabs (Cyclobenzaprine Hcl) .... As Needed 14)  Ventolin Hfa 108 (90 Base) Mcg/act Aers (Albuterol Sulfate) .... As Needed 15)  Gabapentin 300 Mg Caps (Gabapentin) .... Three Times A Day 16)  Qvar 80 Mcg/act  Aers (Beclomethasone Dipropionate) .... Two  Puffs Twice Daily 17)  Spiriva Handihaler 18 Mcg  Caps (Tiotropium Bromide Monohydrate) .... Two Puffs in Handihaler Daily  Allergies (verified): No Known Drug Allergies  Past History:  Past medical, surgical, family and social histories (including risk factors) reviewed, and no changes noted (except as noted below).  Past Medical History: Reviewed history from 07/04/2008 and no changes required. Emphysema Hypertension Allergic Rhinitis G E R D Hx of RUL Granulomatous Dz      -s/p RUL resection 06/2006     -path : granulomatous inflammation     -All AFB/fungal cultures/stains negative  Past Surgical History: Reviewed history from 07/04/2008 and no changes required. s/p VATS 06/2006  per Dr Edwyna Shell:   Linnell Fulling lung DZ  RUL Appendectomy Tonsillectomy  Family  History: Reviewed history from 05/15/2007 and no changes required. dystonia Emphysema:   Social History: Reviewed history from 05/15/2007 and no changes required. 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   Review of Systems       The patient complains of shortness of breath with activity, shortness of breath at rest, productive cough, non-productive cough, acid heartburn, indigestion, difficulty swallowing, nasal congestion/difficulty  breathing through nose, and change in color of mucus.  The patient denies coughing up blood, chest pain, irregular heartbeats, loss of appetite, weight change, abdominal pain, sore throat, tooth/dental problems, headaches, sneezing, itching, ear ache, anxiety, depression, hand/feet swelling, joint stiffness or pain, rash, and fever.    Vital Signs:  Patient profile:   60 year old male Height:      69 inches Weight:      187.25 pounds BMI:     27.75 O2 Sat:      96 % on Room air Temp:     97.4 degrees F oral Pulse rate:   79 / minute BP sitting:   152 / 98  (left arm) Cuff size:   regular  Vitals Entered By: Gweneth Dimitri RN (June 01, 2009 9:59 AM)  O2 Flow:  Room air CC: Follow up for colonoscopy clearance by Dr. Doristine Church in [place].  having good days and bad days.  using o2 more than usual with activity.  Comments Medications reviewed with patient Gweneth Dimitri RN  June 01, 2009 10:00 AM    Physical Exam  Additional Exam:  Gen: Pleasant, well nourished, in no distress ENT: no lesions, no postnasal drip Neck: No JVD, no TMG, no carotid bruits Lungs: no use of accessory muscles, no dullness to percussion, distant BS, improved airflow  Cardiovascular: RRR, heart sounds normal, no murmurs or gallops, no peripheral edema Musculoskeletal: No deformities, no cyanosis or clubbing     Impression & Recommendations:  Problem # 1:  EMPHYSEMA (ICD-492.8) Assessment Improved COPD with primary emphysematous component and GERD as a primary driver for airway inflammation plan the pt is cleared for planned EGD and colonoscopy No change in inhaled medications.   Maintain treatment program as currently prescribed.  Medications Added to Medication List This Visit: 1)  Spiriva Handihaler 18 Mcg Caps (Tiotropium bromide monohydrate) .... Two puffs in handihaler daily  Complete Medication List: 1)  Symbicort 160-4.5 Mcg/act Aero (Budesonide-formoterol fumarate) .... Two puffs twice  daily 2)  Flonase 50 Mcg/act Susp (Fluticasone propionate) .Marland Kitchen.. 1  sprays twice daily each nostril 3)  Cozaar 100 Mg Tabs (Losartan potassium) .... Take 1 tab once daily 4)  Omeprazole 20 Mg Cpdr (Omeprazole) .Marland Kitchen.. 1 by mouth twice a day. take one half hour before eating. 5)  Uroxatral 10 Mg Tb24 (Alfuzosin hcl) .Marland Kitchen.. 1 by mouth daily 6)  Oxygen  .... 2l bedtime and as needed 7)  Singulair 10 Mg Tabs (Montelukast sodium) .Marland Kitchen.. 1 once daily 8)  Wellbutrin Sr 150 Mg Xr12h-tab (Bupropion hcl) .... 2 once daily 9)  Restoril 30 Mg Caps (Temazepam) .Marland Kitchen.. 1 by mouth at bedtime 10)  Trazodone Hcl 50 Mg Tabs (Trazodone hcl) .... At bedtime 11)  Aerochamber Mv Misc (Spacer/aero-holding chambers) .... Use as directed 12)  Hydrocodone-acetaminophen 5-500 Mg Tabs (Hydrocodone-acetaminophen) .... As needed 13)  Cyclobenzaprine Hcl 10 Mg Tabs (Cyclobenzaprine hcl) .... As needed 14)  Ventolin Hfa 108 (90 Base) Mcg/act Aers (Albuterol sulfate) .... As needed 15)  Gabapentin 300 Mg Caps (Gabapentin) .... Three times a  day 16)  Qvar 80 Mcg/act Aers (Beclomethasone dipropionate) .... Two  puffs twice daily 17)  Spiriva Handihaler 18 Mcg Caps (Tiotropium bromide monohydrate) .... Two puffs in handihaler daily  Other Orders: Est. Patient Level IV (29562)  Patient Instructions: 1)  Stay on Spiriva, Symbicort and Qvar daily 2)  You are cleared for planned endoscopy and colonoscopy 3)  Return in two months   Appended Document: Pulmonary OV fax to [facility] blackard GI MD in [place]>>>>  [phone]   fax [phone]  Ephriam Knuckles

## 2010-09-08 ENCOUNTER — Encounter: Payer: Self-pay | Admitting: Critical Care Medicine

## 2010-09-12 ENCOUNTER — Encounter: Payer: Self-pay | Admitting: Critical Care Medicine

## 2010-09-12 ENCOUNTER — Ambulatory Visit (INDEPENDENT_AMBULATORY_CARE_PROVIDER_SITE_OTHER): Payer: Medicaid Other | Admitting: Critical Care Medicine

## 2010-09-12 VITALS — BP 134/94 | HR 87 | Temp 97.8°F | Ht 69.0 in | Wt 199.2 lb

## 2010-09-12 DIAGNOSIS — J438 Other emphysema: Secondary | ICD-10-CM

## 2010-09-12 MED ORDER — BUDESONIDE 0.25 MG/2ML IN SUSP: 0.2500 mg | Freq: Two times a day (BID) | RESPIRATORY_TRACT | Status: DC

## 2010-09-12 MED ORDER — ARFORMOTEROL TARTRATE 15 MCG/2ML IN NEBU: 15.0000 ug | INHALATION_SOLUTION | Freq: Two times a day (BID) | RESPIRATORY_TRACT | Status: DC

## 2010-09-12 NOTE — Progress Notes (Signed)
Subjective:    Patient ID: Nicholas Brady, male    DOB: May 30, 1950, 60 y.o.   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  April 13, 2010 2:34 PM  In [place] over the summer. Came back to [place] 1st of December.  Pt now notes dyspnea and cough now worse. There is heavy gray mucus. The pt notes more acid reflux. There is more wheezing noted.  Pt denies any significant sore throat, nasal congestion or excess secretions, fever, chills, sweats, unintended weight loss, pleurtic or exertional chest pain, orthopnea PND, or leg swelling  Pt denies any increase in rescue therapy over baseline, denies waking up needing it or having any early am or nocturnal exacerbations of coughing/wheezing/or dyspnea.  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   Using brovana and budesonide bid and doing well with this. Now has a sl cough if outdoors.   Pt denies any significant sore throat,  Notes nasal congestion or no  excess secretions, no fever, chills, sweats, unintended weight loss, pleurtic or exertional chest pain, orthopnea PND, or leg swelling Pt denies any increase in rescue therapy over baseline, denies waking up needing it or having any early am or nocturnal exacerbations of coughing/wheezing/or dyspnea. Pt also denies any obvious fluctuation in symptoms with  weather or environmental change or other alleviating or aggravating factors    Review of Systems Constitutional:   No  weight loss, night  sweats,  Fevers, chills, fatigue, lassitude. HEENT:   No headaches,  Difficulty swallowing,  Tooth/dental problems,  Sore throat,                No sneezing, itching, ear ache, nasal congestion, post nasal drip,   CV:  No chest pain,  Orthopnea, PND, swelling in lower extremities, anasarca, dizziness, palpitations  GI  occ heartburn, indigestion, no abdominal pain, nausea, vomiting, diarrhea, change in bowel habits, loss of appetite  Resp: notes  shortness of breath with exertion not  at rest.  Notes no  excess mucus, no productive cough,  Notes a  non-productive cough,  No coughing up of blood.  No change in color of mucus.  No wheezing.  No chest wall deformity  Skin: no rash or lesions.  GU: no dysuria, change in color of urine, no urgency or frequency.  No flank pain.  MS:  No joint pain or swelling.  No decreased range of motion.  No back pain.  Psych:  No change in mood or affect. No depression or anxiety.  No memory loss.     Objective:   Physical Exam Gen: Pleasant, well-nourished, in no distress,  normal affect  ENT: No lesions,  mouth clear,  oropharynx clear, no postnasal drip  Neck: No JVD, no TMG, no carotid bruits  Lungs: No use of accessory muscles, no dullness to percussion, distant bs   Cardiovascular: RRR, heart sounds normal, no murmur or gallops, no peripheral edema  Abdomen: soft and  NT, no HSM,  BS normal  Musculoskeletal: No deformities, no cyanosis or clubbing  Neuro: alert, non focal  Skin: Warm, no lesions or rashes        Assessment & Plan:

## 2010-09-12 NOTE — Patient Instructions (Signed)
A new nebulizer machine will be obtained Refills for brovana and pulmicort sent to [facility] No other med changes Return 4 months

## 2010-09-12 NOTE — Assessment & Plan Note (Addendum)
Stable copd Plan No change in medications. Obtain new neb med machine Return in           4 months

## 2010-10-11 NOTE — Assessment & Plan Note (Signed)
OFFICE VISIT   Nicholas Brady, Nicholas Brady  DOB:  Oct 17, 1950                                        January 02, 2007  CHART #:  [id number]   Nicholas Brady came for followup today.  His blood pressure is 133/83,  pulse 71, respirations 18, and SATs are 95%.  Chest x-ray was stable.  I  still saw some nodularity in the lung, which as we had previously  biopsied was a granulomatous process.  He is still having a moderate  amount of pain so I had to give him a refill for his pain medication.  If this continues, I will try him on either Lyrica or Neurontin.  I plan  to see him back again in two months with a chest x-ray.   Ines Bloomer, M.D.  Electronically Signed   DPB/MEDQ  D:  01/02/2007  T:  01/02/2007  Job:  [id number]

## 2010-10-11 NOTE — Assessment & Plan Note (Signed)
OFFICE VISIT   DAX, MURGUIA  DOB:  1951-01-27                                        February 26, 2007  CHART #:  [id number]   His blood pressure is 132/87, pulse 77, respirations 18, and SATs were  94%.  He may have gotten some slight relief from the Neurontin for his  neurogenic pain.  He says it is slightly better and not burning as much.  He recently went to an urgent care after a coughing spell because of the  pain and the coughing and was treated for bronchitis.  He says he gets  more short of breath with exertion.  Chest x-ray today actually shows  marked improvement and just some chronic nodules and chronic pulmonary  changes that would go along with granulomatous disease.  If he continues  to have problems with his lungs, I told him that we would refer him to a  pulmonologist, and I will see him back in two months.  I did give him a  prescription for Lyrica 50 mg to take three times a day and increase it  to 100 three times a day to see if this will help his neurogenic pain.   Ines Bloomer, M.D.  Electronically Signed   DPB/MEDQ  D:  02/26/2007  T:  02/26/2007  Job:  [id number]

## 2010-10-11 NOTE — Assessment & Plan Note (Signed)
OFFICE VISIT   SUHAS, ESTIS  DOB:  1950-12-13                                        November 20, 2006  CHART #:  [id number]   Mr. Downum came today.  His blood pressure is 124/88, pulse 66,  respirations 18, saturations 98%, complained of more dyspnea with half  block exertion.  His chest x-ray is stable, shows those lesions in the  lung, but they are all stable, and chronic obstructive pulmonary  disease.  No evidence of recurrence of any pneumothorax.  Incision  showed evidence of moderate incisional pain.  Plan to see him back in 6  weeks for another chest x-ray.  He has applied for disability from the  state.   Ines Bloomer, M.D.  Electronically Signed   DPB/MEDQ  D:  11/20/2006  T:  11/21/2006  Job:  [id number]

## 2010-10-11 NOTE — Assessment & Plan Note (Signed)
OFFICE VISIT   DECLYN, DELSOL  DOB:  10/11/1950                                        April 23, 2007  CHART #:  [id number]   His blood pressure is 121/80, pulse 62, respirations 18, sats were 90%.  Head, eyes, ears, nose and throat were unremarkable.  Neck was supple,  and chest was clear to auscultation and percussion.  He is still  complaining of right-sided chest pain.  His incisions are well-healed.  His chest x-ray showed chronic obstructive pulmonary disease and some  nodularity.  He is being referred by his medical doctor to a  pulmonologist, and I told him he had seen a pulmonologist before.  Since  he has benign disease with granulomatous disease, we will let him be  followed by his medical doctor, and I will see him back again if he has  any future problems.  I did give him a refill for Lorcet Plus one more  time for pain medication.   Ines Bloomer, M.D.  Electronically Signed   DPB/MEDQ  D:  04/23/2007  T:  04/23/2007  Job:  [id number]

## 2010-10-14 NOTE — H&P (Signed)
[name], Nicholas Brady             ACCOUNT NO.:  [id number]   MEDICAL RECORD NO.:  [id number]          PATIENT TYPE:  INP   LOCATION:  NA                           FACILITY:  [facility]   PHYSICIAN:  Ines Bloomer, M.D. DATE OF BIRTH:  1950/06/17   DATE OF ADMISSION:  DATE OF DISCHARGE:                              HISTORY & PHYSICAL   CHIEF COMPLAINT:  Right upper lobe nodule.   HISTORY OF PRESENT ILLNESS:  This is a 60 year old patient who has a  long history of smoking, was found to have a right upper lobe nodule on  CT scan on chest x-ray and his CT scan showed severe bolus emphysema  with 2 nodules in the right upper lobe and a questionable small nodule  on the left side.  PET scan was done, was positive for the right upper  lobe nodules with one being 2.6 and the other one being 1.2.  On the  left side, the SUV nodule uptake was 0.6.  Pulmonary function tests  showed an FVC of 2.46 with an FEV-1 of 1.38.  He has had no hemoptysis,  fevers, chills, weight loss, excessive sputum.   PAST MEDICAL HISTORY:   MEDICATIONS:  Include:  1. Advair 250/50 mcg daily.  2. Albuterol 50 mg twice a day.  3. Protonix 40 mg daily.  4. Hydrochlorothiazide 25 mg daily.  5. Naprosyn 250 mg a day.  6. Spiriva and Proventil daily.  7. Theophylline 300 mg twice a day.  8. Flonase.  9. Risperidone.  10.Hydrochlorothiazide 50 mg a day.  11.Trazodone 50 mg at night.  12.Amitriptyline 25 mg at night.   NO ALLERGIES.   He has emphysema, tobacco abuse and hypertension.   FAMILY HISTORY:  Negative for vascular disease and cancer.   SOCIAL HISTORY:  He is single, has 4 children.  Smokes a 1/2 pack of  cigarettes a day.  He does not drink alcohol on a regular basis.   REVIEW OF SYSTEMS:  He is 145 pounds.  He is 5 feet 9 inches.  CARDIAC:  He has some chest pain and shortness of breath when lying flat and  shortness of breath with exertion.  PULMONARY:  A productive cough.  GI:  No history of  reflux.  GU:  Frequent urination.  VASCULAR:  He has pain  in his legs when walking and lying flat.  No DVTs or TIAs.  NEUROLOGICAL:  He has dizziness and headaches.  MUSCULOSKELETAL:  Joint  pain.  PSYCHIATRIC:  He has been treated for depression.  INT:  He has  recent increase in his ASAT.  HEMATOLOGICAL:  No problems with bleeding  or anemia.  He does say he bleeds a lot from small cuts.   PHYSICAL EXAMINATION:  GENERAL:  He is a well-developed, Caucasian male  in no acute distress.  VITAL SIGNS:  His blood pressure is 116/64.  Pulse 76.  Respirations 18.  Saturations were 95%.  HEENT:  Head is atraumatic.  Eyes:  Pupils are equal, reactive to light  and accommodation.  Extraocular movements are normal.  Ears:  Tympanic  membranes are  intact.  Nose:  There is no septal deviation.  Throat:  Without lesion.  He does have a goiter.  NECK:  Supple without thyromegaly.  No supraclavicular axillary  adenopathy.  No carotid bruits.  CHEST:  There is some bilateral wheezes.  HEART:  Regular sinus rhythm.  No murmurs.  ABDOMEN:  Soft.  There is no hepatosplenomegaly.  Bowel sounds are  normal.  EXTREMITIES:  Pulses are 2+.  There is no clubbing or edema.  NEUROLOGIC:  Joint x3.  Sensory and motor intact.  SKIN:  Intact.   IMPRESSION:  1. Two right upper lobe lesions, cancer versus inflammatory.  2. Chronic obstructive pulmonary disease.  3. Hypertension.  4. Ongoing tobacco abuse.   PLAN:  Right VATS wedge resection of lesions, possible seed  implantation.      Ines Bloomer, M.D.  Electronically Signed     DPB/MEDQ  D:  06/30/2006  T:  07/01/2006  Job:  [id number]

## 2010-10-14 NOTE — Discharge Summary (Signed)
Nicholas Brady, Nicholas Brady             ACCOUNT NO.:  [id number]   MEDICAL RECORD NO.:  [id number]          PATIENT TYPE:  INP   LOCATION:  2036                         FACILITY:  [facility]   PHYSICIAN:  Ines Bloomer, M.D. DATE OF BIRTH:  [birth year]   DATE OF ADMISSION:  07/03/2006  DATE OF DISCHARGE:                               DISCHARGE SUMMARY   ANTICIPATED DATE OF DISCHARGE:  July 20, 2006   PRIMARY ADMITTING DIAGNOSIS:  Right upper lobe lung nodule.   ADDITIONAL/DISCHARGE DIAGNOSES:  1. Granulomatous inflammation right upper lobe.  2. History of chronic obstructive pulmonary disease.  3. Hypertension.  4. Ongoing tobacco abuse.  5. Persistent air leak with subcutaneous emphysema.   PROCEDURES PERFORMED:  1. Right VATS, right upper lobe wedge resection, lymph node      dissection.  2. Right VATS, right mini thoracotomy with repair of right upper lobe      air leak and pleurectomy.   HISTORY:  The patient is a 60 year old male who was recently found to  have a right upper lobe lung nodule on chest x-ray.  He subsequently  underwent a CT scan which showed severe bullous emphysema with 2 nodules  in the right upper lobe and a questionable small nodule on the left.  A  PET scan was performed which was positive for the right upper lobe  nodule with SUVs of 2.6 in one nodule and 1.2 in the other.  On the left  side SUV uptake was 0.6.  He was seen by Dr. Edwyna Shell and denied any  symptoms.  It was Dr. Scheryl Darter opinion that he should undergo a right  VATS with wedge resection of the lesions for diagnosis at this time.  He  explained the risks, benefits and alternatives of the procedure to the  patient and the patient agreed to proceed.   HOSPITAL COURSE:  Mr. Zabriskie was admitted to [facility] on  July 03, 2006.  He was taken to the operating room and underwent a  right VATS with right upper lobe wedge resection.  He tolerated the  procedure well and was transferred  to the step-down unit in stable  condition.  His pathology, both intraoperatively and final pathology,  were positive for granulomatous inflammatory process with no evidence of  malignancy.  He was seen in consultation by Pulmonary Medicine for  assistance with managing his COPD and was restarted on his home  medications as well as nebulizer therapy and aggressive pulmonary toilet  measures.  Postoperatively he developed significant subcutaneous  emphysema with large air leak in his chest tube.  Initially he required  reinsertion of an anterior chest tube without significant improvement of  his symptoms.  Ultimately his condition continued to worsen and until it  was felt that he would benefit from return to the operating room for a  repair of his air leak.  He was taken back to the operating room on  July 12, 2006, and underwent repair of right upper lobe air leak and  pleurectomy.  He was returned to the floor in stable condition.  Since  that time  he has shown slow but steady improvement.  His subcutaneous  emphysema has slowly resolved.  His air leak has also improved and he  has been switched to a mini Express 500 chest tube management system.  At present he appears to have no significant air leak and it is  anticipated that one of his chest tubes may be able to be removed today,  July 19, 2006.  He is otherwise stable.  He has been afebrile and  his vital signs have been stable throughout the postoperative course.  He is maintaining O2 sats of greater than 90% on room air.  He did  develop a productive cough and a slight increase in his white blood cell  count to 18,000 and was started empirically on Keflex.  His white blood  cell count is starting to trend downward and he has had no fevers.  The  present labs show a hemoglobin of 9.9, hematocrit of 29.5, white count  15.7, platelets 459.  Sodium 133, potassium 4, BUN 4, creatinine 0.65.  His most recent chest x-ray is stable  with only a tiny residual  pneumothorax.  His incisions are all healing well.  He is ambulating the  halls without difficulty.  It is anticipated that if he remains stable  over the next 24-48 hours he will hopefully be ready for discharge home  with his Mini Express 500 in the next 24-48 hours.   DISCHARGE MEDICATIONS:  Are as follows:  1. Tylox 1-2 q.4-6 h p.r.n. for pain.  2. Keflex 500 mg t.i.d. x 1 week.  3. Advair 500/50 one puff b.i.d.  4. Atenolol 50 mg b.i.d.  5. Protonix 40 mg daily.  6. Hydrochlorothiazide 25 mg daily.  7. Naproxen 250 mg b.i.d.  8. Spiriva 1 inhalation daily.  9. Proventil as directed.  10.Theophylline 300 mg b.i.d.  11.Flonase as needed.  12.Wellbutrin SR 150 mg 2 tablets q.a.m.  13.Trazodone 50 mg q.p.m.  14.Amitriptyline 25 mg q.p.m.   DISCHARGE INSTRUCTIONS:  1. He is asked to refrain from driving, heavy lifting or strenuous      activity.  2. He may continue ambulating daily and using his incentive      spirometer.  3. He may shower daily and clean his incisions with soap and water.  4. He will continue his same preoperative diet.   DISCHARGE FOLLOWUP:  1. He will see Dr. Edwyna Shell back in the office in 1 week.  2. He will have chest x-ray at [facility] prior to this      appointment.  3. In the interim if he experiences any problems or has questions he      is asked to contact our office immediately.      Coral Ceo, P.A.      Ines Bloomer, M.D.  Electronically Signed    GC/MEDQ  D:  07/19/2006  T:  07/19/2006  Job:  [id number]   cc:   Pulmonary Critical Care  [facility] office

## 2010-10-14 NOTE — Op Note (Signed)
NAMENICHOLLAS, PERUSSE             ACCOUNT NO.:  [id number]   MEDICAL RECORD NO.:  [id number]          PATIENT TYPE:  INP   LOCATION:  3303                         FACILITY:  [facility]   PHYSICIAN:  Ines Bloomer, M.D. DATE OF BIRTH:  24-Jul-1950   DATE OF PROCEDURE:  DATE OF DISCHARGE:                               OPERATIVE REPORT   PREOPERATIVE DIAGNOSIS:  Persistent air leak, status post wedge  resection.   POSTOPERATIVE DIAGNOSIS:  Persistent air leak, status post wedge  resection.   OPERATION PERFORMED:  Right video-assisted thoracoscopic surgery,  closure of air leak and pleurectomy.   SURGEON:  Ines Bloomer, M.D.   ANESTHESIA:  General anesthesia.   INDICATIONS FOR PROCEDURE:  This patient 10 days previously had had a  wedge resection for granulomatous disease, and was known to have severe  emphysema.  And even though reinforcements have been made of the wedge  line, the patient developed a marked air leak with subcutaneous  emphysema.  He was brought to the operating room for closure.   DESCRIPTION OF PROCEDURE:  He underwent general anesthesia and was  turned to the right lateral thoracotomy position and was prepped and  draped in the usual sterile manner.  Two trocar sites were made in the  anterior and posterior axillary lines at the seventh intercostal space.  Two trocars were inserted, and we then took the lung off the chest wall  using Kaiser ring forceps.  The lung was stuck to the previous incision,  and we had to take that down, and then we opened the previous incision,  which was over the fifth intercostal space, partially dividing the  latissimus and splitting the serratus.  A Tuffier was placed in this  space and the entire lung was taken down.  It appeared to be that the  major portion of the leak was from the staple line, which had torn  inferiorly, leaving a large area of hematoma in the staple line.  This  was repaired with horizontal mattresses of  2-0 Prolene with felt pledget  reinforcement.  There was another small leak in the fissure that was  also closed with a horizontal mattress, and another leak in the right  lower lobe that was closed with a horizontal mattress of 4-0 Prolene.  Then, a pleurectomy was performed, stripping the pleura off from the  apex down to the seventh or eighth ribs superiorly, laterally, medially  and inferiorly.  After all of this had been done, because of the  subcutaneous emphysema, we opened the mediastinum to allow some of the  air to move, and then placed 2 chest tubes through the trocar sites with  extra holes to the apex.  The chest was closed with 5 paracostals, #1  Vicryl in the muscle layer, 2-0 Vicryl in the subcutaneous  tissue.  Dermabond was applied to the skin.  A Marcaine block was done  in the usual surgical fashion.  An On-Q was inserted subparacostally in  the usual fashion, a single On-Q, and then it was secured in place with  Steri-Strips.  The patient was returned to  the recovery room in stable  condition.      Ines Bloomer, M.D.  Electronically Signed     DPB/MEDQ  D:  07/12/2006  T:  07/13/2006  Job:  [id number]   cc:   Dorita Sciara, MD

## 2010-10-14 NOTE — Op Note (Signed)
[name], [name]             ACCOUNT NO.:  [id number]   MEDICAL RECORD NO.:  [id number]          PATIENT TYPE:  INP   LOCATION:  3303                         FACILITY:  [facility]   PHYSICIAN:  Ines Bloomer, M.D. DATE OF BIRTH:  11/10/1950   DATE OF PROCEDURE:  07/03/2006  DATE OF DISCHARGE:                               OPERATIVE REPORT   PREOPERATIVE DIAGNOSES:  Chronic obstructive pulmonary disease and 2  right upper lobe lesions.   POSTOPERATIVE DIAGNOSIS:  Granulomatous process, right upper lobe.   OPERATION PERFORMED:  Right video-assisted thoracoscopic surgery, wedge  of right upper lobe lesions, with node sampling.   SURGEON:  Ines Bloomer, M.D.   FIRST ASSISTANT:  Rowe Clack, P.A.-C.   ANESTHESIA:  General anesthesia.   DESCRIPTION OF PROCEDURE:  After percutaneous insertion of all  monitoring lines, the patient underwent general anesthesia and was  turned to the right lateral thoracotomy position.  A double-lumen tube  was inserted.  The right lung was deflated.  This patient had 2 PET-  positive lesions in the right upper lobe.  He was brought to the  operating room for wedge resections of these, since we were aware that  this was cancer.  He has a history of a long time of tobacco abuse and  chronic obstructive pulmonary disease.  Two trocar sites were made in  the anterior and midaxillary line in the seventh intercostal space.  Two  trocars were inserted.  A 0-degree scope was inserted.  There were  adhesions of the right upper lobe to the chest wall.  A small anterior  thoracotomy incision was made and divided with electrocautery, and a the  serratus was split and partially divided the latissimus.  A small  retractor was inserted.  Through this incision, we were able to insert a  Bovie to take down the adhesions under direct vision with the scope.  Then, we palpated the 2 lesions, marked them, and then divided them with  the Endo-GIA-80 stapler and  the EZ45 stapler with ParaGard  reinforcements.  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  Cultures were taken.  Two chest tubes were inserted through  the chest tube site.  A Marcaine block was done in the usual fashion.  The On-Q was inserted in the usual fashion.  Three drill holes were  placed through the sixth rib, and then paracostals were placed around  through the sixth rib and around the fifth rib and tied down.  The lung  was reexpanded.  The muscle layer was closed with #1 Vicryl, 2-0 Vicryl  in the subcutaneous tissue, and Dermabond for the skin.  The patient  returned to the recovery room in stable condition.     Ines Bloomer, M.D.  Electronically Signed    DPB/MEDQ  D:  07/03/2006  T:  07/03/2006  Job:  [id number]   cc:   Dorita Sciara, MD

## 2010-11-17 ENCOUNTER — Other Ambulatory Visit: Payer: Self-pay | Admitting: Critical Care Medicine

## 2011-02-08 ENCOUNTER — Telehealth: Payer: Self-pay | Admitting: Critical Care Medicine

## 2011-02-08 NOTE — Telephone Encounter (Signed)
I spoke with pt and he states medicare will no longer pay for his brovana. Pt states it is too expensive to pay for out of pocket. Pt is requesting alternative for this medication. I spoke with [facility] to clairfy. I had to leave a message for someone to call back.

## 2011-02-08 NOTE — Telephone Encounter (Signed)
Nicholas Brady, [facility], returned triage nurse call & can be reached at  [phone].  [name]

## 2011-02-10 DIAGNOSIS — E7800 Pure hypercholesterolemia, unspecified: Secondary | ICD-10-CM | POA: Insufficient documentation

## 2011-02-10 DIAGNOSIS — G4700 Insomnia, unspecified: Secondary | ICD-10-CM | POA: Insufficient documentation

## 2011-02-10 DIAGNOSIS — G8929 Other chronic pain: Secondary | ICD-10-CM | POA: Insufficient documentation

## 2011-02-10 DIAGNOSIS — I10 Essential (primary) hypertension: Secondary | ICD-10-CM | POA: Insufficient documentation

## 2011-02-10 NOTE — Telephone Encounter (Signed)
I spoke with Morrie Sheldon at [facility] and she states medicaid now requires a PA for brovana. She states that the only comparable is performist but [facility] does not provide this. I advised to go ahead and start the PA process then for brovana. She states she will have form sent to [facility] attention. Carron Curie, CMA

## 2011-02-14 ENCOUNTER — Ambulatory Visit: Payer: Self-pay | Admitting: Critical Care Medicine

## 2011-02-22 ENCOUNTER — Telehealth: Payer: Self-pay | Admitting: Critical Care Medicine

## 2011-02-22 NOTE — Telephone Encounter (Signed)
Called and spoke with pts wife linda and she is aware that [facility] faxed back the PA for the brovanna today and it normally takes 24-72 hours for answer.  She is aware of this and pt is out of the brovanna so i left some samples up front for her to pick up tomorrow.

## 2011-02-23 ENCOUNTER — Telehealth: Payer: Self-pay | Admitting: Critical Care Medicine

## 2011-02-23 MED ORDER — ALBUTEROL SULFATE (2.5 MG/3ML) 0.083% IN NEBU: 2.5000 mg | INHALATION_SOLUTION | Freq: Four times a day (QID) | RESPIRATORY_TRACT | Status: AC | PRN

## 2011-02-23 NOTE — Telephone Encounter (Signed)
Brovana PA DENIED by Medicaid because pt has not been on a 30 day trial of Albuterol 0.083% Sol and failed. Medicaid is aware of the pt's previous use of Spiriva, Symbicort, Qvar abd Advair. Pls advise.

## 2011-02-23 NOTE — Telephone Encounter (Signed)
Change brovana to albuterol in nebulizer qid  Let pt know of issue

## 2011-02-23 NOTE — Telephone Encounter (Signed)
Pt has been notified of new albuterol prescription to use in nebulizer four times daily and wants this called to [facility]. RX called to [facility] and verbal order given to their pharmacy. Medication list has been updated to reflect this change.

## 2011-03-03 LAB — BASIC METABOLIC PANEL
BUN: 21 mg/dL (ref 6–23)
Calcium: 9 mg/dL (ref 8.4–10.5)
GFR calc non Af Amer: >60 mL/min (ref >60)
Glucose, Bld: 128 mg/dL — ABNORMAL HIGH (ref 70–99)

## 2011-03-03 LAB — DIFFERENTIAL
Basophils Absolute: 0 10*3/uL (ref 0.0–0.1)
Basophils Relative: 1 % (ref 0–1)
Eosinophils Relative: 3 % (ref 0–5)
Lymphocytes Relative: 18 % (ref 12–46)
Neutro Abs: 6 10*3/uL (ref 1.7–7.7)

## 2011-03-03 LAB — CARDIAC PANEL(CRET KIN+CKTOT+MB+TROPI): CK, MB: 2.4 ng/mL (ref 0.3–4.0)

## 2011-03-03 LAB — POCT CARDIAC MARKERS
CKMB, poc: 1.9 ng/mL (ref 1.0–8.0)
Troponin i, poc: <0.05 ng/mL (ref 0.00–0.09)

## 2011-03-03 LAB — CBC
Platelets: 219 10*3/uL (ref 150–400)
RDW: 13.9 % (ref 11.5–15.5)

## 2011-03-16 ENCOUNTER — Ambulatory Visit (INDEPENDENT_AMBULATORY_CARE_PROVIDER_SITE_OTHER): Payer: Medicaid Other | Admitting: Adult Health

## 2011-03-16 ENCOUNTER — Encounter: Payer: Self-pay | Admitting: Adult Health

## 2011-03-16 VITALS — BP 130/92 | HR 89 | Temp 97.4°F | Ht 69.0 in | Wt 199.6 lb

## 2011-03-16 DIAGNOSIS — J438 Other emphysema: Secondary | ICD-10-CM

## 2011-03-16 MED ORDER — AMOXICILLIN-POT CLAVULANATE 875-125 MG PO TABS: 1.0000 | ORAL_TABLET | Freq: Two times a day (BID) | ORAL | Status: AC

## 2011-03-16 NOTE — Progress Notes (Signed)
Subjective:    Patient ID: Nicholas Brady, male    DOB: 1951/02/26, 60 y.o.   MRN: [id number]  HPI  This is a 60 year old, white male with advanced chronic obstructive lung disease with a primary emphysematous component here for follow up.  April 13, 2010 2:34 PM  In [place] over the summer. Came back to [place] 1st of December.  Pt now notes dyspnea and cough now worse. There is heavy gray mucus. The pt notes more acid reflux. There is more wheezing noted.  Pt denies any significant sore throat, nasal congestion or excess secretions, fever, chills, sweats, unintended weight loss, pleurtic or exertional chest pain, orthopnea PND, or leg swelling  Pt denies any increase in rescue therapy over baseline, denies waking up needing it or having any early am or nocturnal exacerbations of coughing/wheezing/or dyspnea.  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   Using brovana and budesonide bid and doing well with this. Now has a sl cough if outdoors.   >>No changes   03/16/2011 Acute OV  Complains of intermittent productive cough with green mucus, wheezing, SOB all the time worse with exertion, nasal congestion in the mornings for 2 weeks . Recently in [place] for 2 months visiitng family  No otc or abx use.  No chest pain or hemoptysis . No calf pain or edema. Cough is getting worse for last week.    Review of Systems  Constitutional:   No  weight loss, night sweats,  Fevers, chills, fatigue, lassitude. HEENT:   No headaches,  Difficulty swallowing,  Tooth/dental problems,  Sore throat,                No  sneezing, itching, ear ache, +nasal congestion, post nasal drip,   CV:  No chest pain,  Orthopnea, PND, swelling in lower extremities, anasarca, dizziness, palpitations  GI  occ heartburn, indigestion, no abdominal pain, nausea, vomiting, diarrhea, change in bowel habits, loss of appetite  Resp: notes  shortness of breath with exertion not  at rest.   No coughing up of blood.    No chest wall deformity  Skin: no rash or lesions.  GU: no dysuria, change in color of urine, no urgency or frequency.  No flank pain.  MS:  No joint pain or swelling.  No decreased range of motion.  No back pain.  Psych:  No change in mood or affect. No depression or anxiety.  No memory loss.     Objective:   Physical Exam  Gen: Pleasant, well-nourished, in no distress,  normal affect  ENT: No lesions,  mouth clear,  oropharynx clear, no postnasal drip  Neck: No JVD, no TMG, no carotid bruits  Lungs: coarse BS w/ no wheezing , diminshed in bases   Cardiovascular: RRR, heart sounds normal, no murmur or gallops, no peripheral edema  Abdomen: soft and NT, no HSM,  BS normal  Musculoskeletal: No deformities, no cyanosis or clubbing  Neuro: alert, non focal  Skin: Warm, no lesions or rashes  Assessment & Plan:  EMPHYSEMA COPD flare with bronchitic flare   Plan:  Augmentin 875mg  Twice daily  For 7 days -take with food.  Mucinex DM Twice daily  As needed  Cough/congestion  Fluids and rest  Please contact office for sooner follow up if symptoms do not improve or worsen or seek emergency care  follow up Dr. Delford Field  In 2 weeks as planned and As needed

## 2011-03-16 NOTE — Patient Instructions (Signed)
Augmentin 875mg  Twice daily  For 7 days -take with food.  Mucinex DM Twice daily  As needed  Cough/congestion  Fluids and rest  Please contact office for sooner follow up if symptoms do not improve or worsen or seek emergency care  follow up Dr. Delford Field  In 2 weeks as planned and As needed

## 2011-03-17 NOTE — Assessment & Plan Note (Signed)
COPD flare with bronchitic flare   Plan:  Augmentin 875mg  Twice daily  For 7 days -take with food.  Mucinex DM Twice daily  As needed  Cough/congestion  Fluids and rest  Please contact office for sooner follow up if symptoms do not improve or worsen or seek emergency care  follow up Dr. Delford Field  In 2 weeks as planned and As needed

## 2011-03-29 ENCOUNTER — Ambulatory Visit (INDEPENDENT_AMBULATORY_CARE_PROVIDER_SITE_OTHER): Payer: Medicaid Other | Admitting: Critical Care Medicine

## 2011-03-29 ENCOUNTER — Encounter: Payer: Self-pay | Admitting: Critical Care Medicine

## 2011-03-29 DIAGNOSIS — J438 Other emphysema: Secondary | ICD-10-CM

## 2011-03-29 DIAGNOSIS — E669 Obesity, unspecified: Secondary | ICD-10-CM | POA: Insufficient documentation

## 2011-03-29 DIAGNOSIS — Z23 Encounter for immunization: Secondary | ICD-10-CM

## 2011-03-29 NOTE — Patient Instructions (Signed)
Flu vaccine today was given No change in medications. Return in       4 months Focus on weight loss

## 2011-03-29 NOTE — Progress Notes (Signed)
Subjective:    Patient ID: Nicholas Brady, male    DOB: 1951-04-03, 60 y.o.   MRN: [id number]  HPI  This is a 60 year old, white male with advanced chronic obstructive lung disease with a primary emphysematous component here for follow up.  April 13, 2010 2:34 PM  In [place] over the summer. Came back to [place] 1st of December.  Pt now notes dyspnea and cough now worse. There is heavy gray mucus. The pt notes more acid reflux. There is more wheezing noted.  Pt denies any significant sore throat, nasal congestion or excess secretions, fever, chills, sweats, unintended weight loss, pleurtic or exertional chest pain, orthopnea PND, or leg swelling  Pt denies any increase in rescue therapy over baseline, denies waking up needing it or having any early am or nocturnal exacerbations of coughing/wheezing/or dyspnea.  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   Using brovana and budesonide bid and doing well with this. Now has a sl cough if outdoors.   >>No changes   10/18Acute OV  Complains of intermittent productive cough with green mucus, wheezing, SOB all the time worse with exertion, nasal congestion in the mornings for 2 weeks . Recently in [place] for 2 months visiitng family  No otc or abx use.  No chest pain or hemoptysis . No calf pain or edema. Cough is getting worse for last week.   03/29/2011 Pt seen with NP 10/18 for acute ov. Rx was Augmentin 875mg  Twice daily For 7 days  Abx has helped.  Now not much cough.  Still with dyspnea. Still with weight gain.     Review of Systems  Constitutional:   No   weight loss, night sweats,  Fevers, chills, fatigue, lassitude. HEENT:   No headaches,  Difficulty swallowing,  Tooth/dental problems,  Sore throat,                No sneezing, itching, ear ache, +nasal congestion, post nasal drip,   CV:  No chest pain,  Orthopnea, PND, swelling in lower extremities, anasarca, dizziness, palpitations  GI  occ heartburn, indigestion, no abdominal pain, nausea, vomiting, diarrhea, change in bowel habits, loss of appetite  Resp: notes  shortness of breath with exertion not  at rest.   No coughing up of blood.    No chest wall deformity  Skin: no rash or lesions.  GU: no dysuria, change in color of urine, no urgency or frequency.  No flank pain.  MS:  No joint pain or swelling.  No decreased range of motion.  No back pain.  Psych:  No change in mood or affect. No depression or anxiety.  No memory loss.     Objective:   Physical Exam BP 116/80  Pulse 75  Temp(Src) 98 F (36.7 C) (Oral)  Ht 5\' 9"  (1.753 m)  Wt 200 lb 9.6 oz (90.992 kg)  BMI 29.62 kg/m2  SpO2 95%  Gen: Pleasant, well-nourished, in no distress,  normal affect  ENT: No lesions,  mouth clear,  oropharynx clear, no postnasal drip  Neck: No JVD, no TMG, no carotid bruits  Lungs: coarse BS w/ no wheezing , diminshed in bases   Cardiovascular: RRR, heart sounds normal, no murmur or gallops, no peripheral edema  Abdomen: soft and NT, no HSM,  BS normal  Musculoskeletal: No deformities, no cyanosis or clubbing  Neuro: alert, non focal  Skin: Warm, no lesions or rashes        Assessment & Plan:  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

## 2011-03-29 NOTE — Assessment & Plan Note (Signed)
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

## 2011-03-31 ENCOUNTER — Ambulatory Visit: Payer: Self-pay | Admitting: Critical Care Medicine

## 2011-05-11 ENCOUNTER — Ambulatory Visit: Payer: Medicaid Other | Admitting: Adult Health

## 2011-06-26 DIAGNOSIS — Z8719 Personal history of other diseases of the digestive system: Secondary | ICD-10-CM | POA: Insufficient documentation

## 2011-07-07 ENCOUNTER — Ambulatory Visit (INDEPENDENT_AMBULATORY_CARE_PROVIDER_SITE_OTHER): Payer: Medicaid Other | Admitting: Critical Care Medicine

## 2011-07-07 ENCOUNTER — Encounter: Payer: Self-pay | Admitting: Critical Care Medicine

## 2011-07-07 ENCOUNTER — Ambulatory Visit (INDEPENDENT_AMBULATORY_CARE_PROVIDER_SITE_OTHER)
Admission: RE | Admit: 2011-07-07 | Discharge: 2011-07-07 | Disposition: A | Discharge status: Home / Self Care | Payer: Medicaid Other | Source: Ambulatory Visit | Attending: Critical Care Medicine | Admitting: Critical Care Medicine

## 2011-07-07 VITALS — BP 120/90 | HR 95 | Temp 98.3°F | Ht 69.0 in | Wt 200.2 lb

## 2011-07-07 DIAGNOSIS — J209 Acute bronchitis, unspecified: Secondary | ICD-10-CM

## 2011-07-07 DIAGNOSIS — J438 Other emphysema: Secondary | ICD-10-CM

## 2011-07-07 MED ORDER — PREDNISONE 10 MG PO TABS: ORAL_TABLET | ORAL | Status: DC

## 2011-07-07 MED ORDER — AZITHROMYCIN 250 MG PO TABS: 250.0000 mg | ORAL_TABLET | Freq: Every day | ORAL | Status: AC

## 2011-07-07 NOTE — Assessment & Plan Note (Signed)
Moderate chronic obstructive lung disease with primary emphysematous component now with acute tracheobronchitis and flare Plan Azithromycin for 5 days Pulse prednisone Maintain inhaled medications as prescribed

## 2011-07-07 NOTE — Progress Notes (Signed)
Subjective:    Patient ID: Nicholas Brady, male    DOB: 04/10/1951, 61 y.o.   MRN: [id number]  HPI  61 y.o.   white male with advanced chronic obstructive lung disease with a primary emphysematous component here for follow up.    07/07/2011 Notes for three weeks of cough and dyspnea, blood out of nose.  Mucus is thick and tenaceous. Pt notes ongoing wheezing.  Dyspnea is worse.   Past Medical History  Diagnosis Date  . Chronic airway obstruction, not elsewhere classified   . Unspecified essential hypertension   . Allergic rhinitis, cause unspecified   . Esophageal reflux      Family History  Problem Relation Age of Onset  . Emphysema       History   Social History  . Marital Status: Divorced    Spouse Name: N/A    Number of Children: N/A  . Years of Education: N/A   Occupational History  . Disabled    Social History Main Topics  . Smoking status: Former Smoker -- 4.0 packs/day for 45 years    Types: Cigarettes    Quit date: 06/29/2006  . Smokeless tobacco: Never Used  . Alcohol Use: No  . Drug Use: Not on file  . Sexually Active: Not on file   Other Topics Concern  . Not on file   Social History Narrative  . No narrative on file     No Known Allergies   Outpatient Prescriptions Prior to Visit  Medication Sig Dispense Refill  . albuterol (PROVENTIL) (2.5 MG/3ML) 0.083% nebulizer solution Take 2.5 mg by nebulization every 6 (six) hours as needed for wheezing.  360 mL  5  . alfuzosin (UROXATRAL) 10 MG 24 hr tablet Take 10 mg by mouth daily.        Marland Kitchen amLODipine-olmesartan (AZOR) 5-40 MG per tablet Take 1 tablet by mouth daily.        . cyclobenzaprine (FLEXERIL) 10 MG tablet Take 10 mg by mouth daily.       Marland Kitchen esomeprazole (NEXIUM) 40 MG capsule Take 40 mg by mouth daily before breakfast.        . fluticasone (FLONASE) 50 MCG/ACT nasal spray Place 2 sprays into the nose daily as needed.       . gabapentin (NEURONTIN) 400 MG capsule Take 400 mg by mouth  daily.       . montelukast (SINGULAIR) 10 MG tablet Take 10 mg by mouth at bedtime.        . naproxen (NAPROSYN) 500 MG tablet Take 500 mg by mouth 2 (two) times daily as needed.       Marland Kitchen oxyCODONE-acetaminophen (PERCOCET) 7.5-325 MG per tablet Take 1 tablet by mouth every 6 (six) hours as needed.        Marland Kitchen SPIRIVA HANDIHALER 18 MCG inhalation capsule inhale contents of 1 capsule by mouth once daily  30 capsule  6  . traZODone (DESYREL) 50 MG tablet Take 50 mg by mouth 2 (two) times daily.        . VENTOLIN HFA 108 (90 BASE) MCG/ACT inhaler inhale 1 to 2 puffs every 4 to 6 hours if needed  18 g  6  . zolpidem (AMBIEN) 10 MG tablet Take 10 mg by mouth at bedtime.        . budesonide (PULMICORT) 0.25 MG/2ML nebulizer solution Take 2 mLs (0.25 mg total) by nebulization 2 (two) times daily.  60 mL  11      Review of  Systems  Constitutional:   No  weight loss, night sweats,  Fevers, chills, fatigue, lassitude. HEENT:   No headaches,  Difficulty swallowing,  Tooth/dental problems,  Sore throat,                No sneezing, itching, ear ache, +nasal congestion, post nasal drip,   CV:  No chest pain,  Orthopnea, PND, swelling in lower extremities, anasarca, dizziness, palpitations  GI  occ heartburn, indigestion, no abdominal pain, nausea, vomiting, diarrhea, change in bowel habits, loss of appetite  Resp: notes  shortness of breath with exertion not  at rest.   No coughing up of blood.    No chest wall deformity  Skin: no rash or lesions.  GU: no dysuria, change in color of urine, no urgency or frequency.  No flank pain.  MS:  No joint pain or swelling.  No decreased range of motion.  No back pain.  Psych:  No change in mood or affect. No depression or anxiety.  No memory loss.     Objective:   Physical Exam BP 120/90  Pulse 95  Temp(Src) 98.3 F (36.8 C) (Oral)  Ht 5\' 9"  (1.753 m)  Wt 200 lb 3.2 oz (90.81 kg)  BMI 29.56 kg/m2  SpO2 93%  Gen: Pleasant, well-nourished, in no  distress,  normal affect  ENT: No lesions,  mouth clear,  oropharynx clear, no postnasal drip  Neck: No JVD, no TMG, no carotid bruits  Lungs: coarse BS  , diminshed in bases , exp wheezes   Cardiovascular: RRR, heart sounds normal, no murmur or gallops, no peripheral edema  Abdomen: soft and NT, no HSM,  BS normal  Musculoskeletal: No deformities, no cyanosis or clubbing  Neuro: alert, non focal  Skin: Warm, no lesions or rashes        Assessment & Plan:  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

## 2011-07-07 NOTE — Patient Instructions (Addendum)
Prednisone 10mg  Take 4 for three days 3 for three days 2 for three days 1 for three days and stop Azithromycin 250mg  Take two once then one daily until gone Use albuterol twice daily in nebulizer No other med changes Chest xray today Return 1 month

## 2011-07-07 NOTE — Progress Notes (Signed)
Quick Note:  Notify the patient that the Xray is stable and no pneumonia No change in medications are recommended. Continue current meds as prescribed at last office visit ______ 

## 2011-07-31 ENCOUNTER — Telehealth: Payer: Self-pay | Admitting: Critical Care Medicine

## 2011-07-31 MED ORDER — ALBUTEROL SULFATE HFA 108 (90 BASE) MCG/ACT IN AERS: INHALATION_SPRAY | RESPIRATORY_TRACT | Status: DC

## 2011-07-31 NOTE — Telephone Encounter (Signed)
Pt was not requesting refills on Advair. Currently is on Spiriva and albuterol neb/HFA medications. Pt is requesting to have Symbicort called into the pharmacy to replace the albuterol neb while he is traveling out of town. I advised pt that Symbicort is not the equivalent and that the albuterol HFA and neb medications are the same medications. Pt is aware PW is out of the office and request to wait to see what are recs are. Please advise, thank you.

## 2011-08-01 MED ORDER — DOXYCYCLINE HYCLATE 100 MG PO CAPS: 100.0000 mg | ORAL_CAPSULE | Freq: Two times a day (BID) | ORAL | Status: AC

## 2011-08-01 MED ORDER — PREDNISONE 10 MG PO TABS: ORAL_TABLET | ORAL | Status: DC

## 2011-08-01 NOTE — Telephone Encounter (Signed)
[email or web address].Jemila Camille, CMA  

## 2011-08-01 NOTE — Telephone Encounter (Signed)
This pt was on brovana and budesonide but could not afford this so at last ov we switched him to Albuterol in neb bid and spiriva  He should :    Use albuterol in nebulizer 3-4 times daily Stay on spiriva Symbicort will not offer an advantage  If he is ill: call in prednisone 10mg  Take 4 for three days 3 for three days 2 for three days 1 for three days and stop  #30 Doxycycline 100mg  bid x 7days

## 2011-08-01 NOTE — Telephone Encounter (Signed)
Pt aware of Pw recs and RX for Doxycycline and Prednisone taper sent to [facility] in [place]. Pt will fill prescriptions to take with him to [place] in case he needs them. Pt will call to let PW know he had to take these medications.

## 2011-08-01 NOTE — Telephone Encounter (Signed)
Pt returning call can be reached at [phone].Nicholas Brady

## 2011-08-08 ENCOUNTER — Ambulatory Visit: Payer: Medicaid Other | Admitting: Critical Care Medicine

## 2011-11-01 ENCOUNTER — Encounter: Payer: Self-pay | Admitting: Critical Care Medicine

## 2011-11-01 ENCOUNTER — Ambulatory Visit (INDEPENDENT_AMBULATORY_CARE_PROVIDER_SITE_OTHER): Payer: Medicaid Other | Admitting: Critical Care Medicine

## 2011-11-01 VITALS — BP 120/70 | HR 80 | Temp 97.5°F | Ht 69.0 in | Wt 199.0 lb

## 2011-11-01 DIAGNOSIS — J438 Other emphysema: Secondary | ICD-10-CM

## 2011-11-01 MED ORDER — PREDNISONE 10 MG PO TABS: ORAL_TABLET | ORAL | Status: DC

## 2011-11-01 MED ORDER — ROFLUMILAST 500 MCG PO TABS: 500.0000 ug | ORAL_TABLET | Freq: Every day | ORAL | Status: DC

## 2011-11-01 NOTE — Progress Notes (Signed)
Subjective:    Patient ID: MELVIN WHITEFORD, male    DOB: 23-Oct-1950, 61 y.o.   MRN: [id number]  HPI  61 y.o.   white male with advanced chronic obstructive lung disease   11/01/2011 Pt notes is worse for three weeks.  Notes more cough for one week.  Pt notes thick gray mucus.  Notes more dyspnea with this.  Hard time raising secretions.  No edema in feet.  No chest pain. No GERD symptoms.  Notes pndrip.  Occ sore throat.  Notes some qhs dyspnea.  Past Medical History  Diagnosis Date  . Chronic airway obstruction, not elsewhere classified   . Unspecified essential hypertension   . Allergic rhinitis, cause unspecified   . Esophageal reflux      Family History  Problem Relation Age of Onset  . Emphysema       History   Social History  . Marital Status: Divorced    Spouse Name: N/A    Number of Children: N/A  . Years of Education: N/A   Occupational History  . Disabled    Social History Main Topics  . Smoking status: Former Smoker -- 4.0 packs/day for 45 years    Types: Cigarettes    Quit date: 06/29/2006  . Smokeless tobacco: Never Used  . Alcohol Use: No  . Drug Use: Not on file  . Sexually Active: Not on file   Other Topics Concern  . Not on file   Social History Narrative  . No narrative on file     No Known Allergies   Outpatient Prescriptions Prior to Visit  Medication Sig Dispense Refill  . albuterol (PROVENTIL) (2.5 MG/3ML) 0.083% nebulizer solution Take 2.5 mg by nebulization every 6 (six) hours as needed for wheezing.  360 mL  5  . albuterol (VENTOLIN HFA) 108 (90 BASE) MCG/ACT inhaler inhale 1 to 2 puffs every 4 to 6 hours if needed  18 g  6  . alfuzosin (UROXATRAL) 10 MG 24 hr tablet Take 10 mg by mouth daily.        Marland Kitchen amLODipine-olmesartan (AZOR) 5-40 MG per tablet Take 1 tablet by mouth daily.        . cyclobenzaprine (FLEXERIL) 10 MG tablet Take 10 mg by mouth daily.       Marland Kitchen esomeprazole (NEXIUM) 40 MG capsule Take 40 mg by mouth daily before  breakfast.        . fluticasone (FLONASE) 50 MCG/ACT nasal spray Place 2 sprays into the nose daily as needed.       . montelukast (SINGULAIR) 10 MG tablet Take 10 mg by mouth at bedtime.        . naproxen (NAPROSYN) 500 MG tablet Take 500 mg by mouth 2 (two) times daily as needed.       Marland Kitchen oxyCODONE-acetaminophen (PERCOCET) 7.5-325 MG per tablet Take 1 tablet by mouth every 6 (six) hours as needed.        Marland Kitchen SPIRIVA HANDIHALER 18 MCG inhalation capsule inhale contents of 1 capsule by mouth once daily  30 capsule  6  . traZODone (DESYREL) 50 MG tablet Take 50 mg by mouth 2 (two) times daily.        Marland Kitchen gabapentin (NEURONTIN) 400 MG capsule Take 400 mg by mouth 3 (three) times daily.       . predniSONE (DELTASONE) 10 MG tablet Take 4 for three days 3 for three days 2 for three days 1 for three days and stop  30 tablet  0  . zolpidem (AMBIEN) 10 MG tablet Take 10 mg by mouth at bedtime.            Review of Systems  Constitutional:   No  weight loss, night sweats,  Fevers, chills, fatigue, lassitude. HEENT:   No headaches,  Difficulty swallowing,  Tooth/dental problems,  Sore throat,                No sneezing, itching, ear ache, +nasal congestion, post nasal drip,   CV:  No chest pain,  Orthopnea, PND, swelling in lower extremities, anasarca, dizziness, palpitations  GI  occ heartburn, indigestion, no abdominal pain, nausea, vomiting, diarrhea, change in bowel habits, loss of appetite  Resp: notes  shortness of breath with exertion not  at rest.   No coughing up of blood.    No chest wall deformity  Skin: no rash or lesions.  GU: no dysuria, change in color of urine, no urgency or frequency.  No flank pain.  MS:  No joint pain or swelling.  No decreased range of motion.  No back pain.  Psych:  No change in mood or affect. No depression or anxiety.  No memory loss.     Objective:   Physical Exam BP 120/70  Pulse 80  Temp(Src) 97.5 F (36.4 C) (Oral)  Ht 5\' 9"  (1.753 m)  Wt 199 lb  (90.266 kg)  BMI 29.39 kg/m2  SpO2 93%  Gen: Pleasant, well-nourished, in no distress,  normal affect  ENT: No lesions,  mouth clear,  oropharynx clear, no postnasal drip  Neck: No JVD, no TMG, no carotid bruits  Lungs: coarse BS  , diminshed in bases , exp wheezes   Cardiovascular: RRR, heart sounds normal, no murmur or gallops, no peripheral edema  Abdomen: soft and NT, no HSM,  BS normal  Musculoskeletal: No deformities, no cyanosis or clubbing  Neuro: alert, non focal  Skin: Warm, no lesions or rashes        Assessment & Plan:  Obstructive chronic bronchitis with exacerbation Chronic obstructive lung disease gold stage C . with frequent re\re exacerbations. Plan Begin Daliresp  500 mcg daily Continue Spiriva Prednisone pulse

## 2011-11-01 NOTE — Patient Instructions (Addendum)
Prednisone 10mg  4 daily until gone for 5days Start Daliresp one daily when off prednisone No other medication changes Return 3 months

## 2011-11-01 NOTE — Assessment & Plan Note (Signed)
Chronic obstructive lung disease gold stage C . with frequent re\re exacerbations. Plan Begin Daliresp  500 mcg daily Continue Spiriva Prednisone pulse

## 2011-11-21 ENCOUNTER — Other Ambulatory Visit: Payer: Self-pay | Admitting: Critical Care Medicine

## 2012-02-06 ENCOUNTER — Telehealth: Payer: Self-pay | Admitting: Critical Care Medicine

## 2012-02-06 NOTE — Telephone Encounter (Signed)
Attempted to call pt x's 3 to make next ov per recall.  Left message x's 3.  Mailed recall letter on 01/30/12.  Did not hear back from pt Nicholas Brady  °

## 2012-02-15 ENCOUNTER — Other Ambulatory Visit: Payer: Self-pay | Admitting: Critical Care Medicine

## 2012-04-09 ENCOUNTER — Encounter: Payer: Self-pay | Admitting: Critical Care Medicine

## 2012-04-09 ENCOUNTER — Ambulatory Visit (INDEPENDENT_AMBULATORY_CARE_PROVIDER_SITE_OTHER): Payer: Medicaid Other | Admitting: Critical Care Medicine

## 2012-04-09 ENCOUNTER — Ambulatory Visit (INDEPENDENT_AMBULATORY_CARE_PROVIDER_SITE_OTHER)
Admission: RE | Admit: 2012-04-09 | Discharge: 2012-04-09 | Disposition: A | Discharge status: Home / Self Care | Payer: Medicaid Other | Source: Ambulatory Visit | Attending: Critical Care Medicine | Admitting: Critical Care Medicine

## 2012-04-09 VITALS — BP 158/78 | HR 89 | Temp 98.2°F | Ht 69.0 in | Wt 182.6 lb

## 2012-04-09 DIAGNOSIS — J441 Chronic obstructive pulmonary disease with (acute) exacerbation: Secondary | ICD-10-CM

## 2012-04-09 DIAGNOSIS — Z23 Encounter for immunization: Secondary | ICD-10-CM

## 2012-04-09 MED ORDER — FLUTICASONE PROPIONATE 50 MCG/ACT NA SUSP: 2.0000 | Freq: Every day | NASAL | Status: DC | PRN

## 2012-04-09 MED ORDER — FLUTICASONE FUROATE-VILANTEROL 100-25 MCG/INH IN AEPB: 1.0000 | INHALATION_SPRAY | Freq: Every day | RESPIRATORY_TRACT | Status: DC

## 2012-04-09 NOTE — Patient Instructions (Addendum)
Trial Breo one puff daily, use samples and free one month trial refill Stay on spiriva Stay on singulair Stay on Daliresp Get Chest xray today, will call result Flu vaccine was given Return 2 months

## 2012-04-09 NOTE — Assessment & Plan Note (Signed)
Chronic obstructive lung disease gold stage C. with frequent exacerbations Recent exacerbation now stable Plan Trial Breo one puff daily, use samples and free one month trial refill Stay on spiriva Stay on singulair Stay on Daliresp Flu vaccine

## 2012-04-09 NOTE — Progress Notes (Signed)
Quick Note:  Notify the patient that the Xray is stable and no pneumonia No change in medications are recommended. Continue current meds as prescribed at last office visit ______ 

## 2012-04-09 NOTE — Progress Notes (Signed)
Subjective:    Patient ID: Nicholas Brady, male    DOB: [birth year], 61 y.o.   MRN: [id number]  HPI  61 y.o.   white male with advanced chronic obstructive lung disease   11/01/2011 Pt notes is worse for three weeks.  Notes more cough for one week.  Pt notes thick gray mucus.  Notes more dyspnea with this.  Hard time raising secretions.  No edema in feet.  No chest pain. No GERD symptoms.  Notes pndrip.  Occ sore throat.  Notes some qhs dyspnea.  04/09/2012 At last ov we started Daliresp . Pt notes more grey mucus.  Phlegm started to turn yellow.  No is off white .  Pt was in [place] all summer and had to take travel doxy/pred and finished 1.5 weeks ago. Mucus was yellow now is gray to white.  Notes some wheezing.  Pt notes more dyspnea now compared to before. Has proair now.      Past Medical History  Diagnosis Date  . Chronic airway obstruction, not elsewhere classified   . Unspecified essential hypertension   . Allergic rhinitis, cause unspecified   . Esophageal reflux      Family History  Problem Relation Age of Onset  . Emphysema       History   Social History  . Marital Status: Divorced    Spouse Name: N/A    Number of Children: N/A  . Years of Education: N/A   Occupational History  . Disabled    Social History Main Topics  . Smoking status: Former Smoker -- 4.0 packs/day for 45 years    Types: Cigarettes    Quit date: 06/29/2006  . Smokeless tobacco: Never Used  . Alcohol Use: No  . Drug Use: Not on file  . Sexually Active: Not on file   Other Topics Concern  . Not on file   Social History Narrative  . No narrative on file     No Known Allergies   Outpatient Prescriptions Prior to Visit  Medication Sig Dispense Refill  . albuterol (PROVENTIL) (2.5 MG/3ML) 0.083% nebulizer solution Take 2.5 mg by nebulization every 6 (six) hours as needed for wheezing.  360 mL  5  . albuterol (VENTOLIN HFA) 108 (90 BASE) MCG/ACT inhaler inhale 1 to 2 puffs every  4 to 6 hours if needed  18 g  6  . alfuzosin (UROXATRAL) 10 MG 24 hr tablet Take 10 mg by mouth daily.        . cyclobenzaprine (FLEXERIL) 10 MG tablet Take 10 mg by mouth daily as needed.       Marland Kitchen esomeprazole (NEXIUM) 40 MG capsule Take 40 mg by mouth daily before breakfast.        . montelukast (SINGULAIR) 10 MG tablet Take 10 mg by mouth at bedtime.        Marland Kitchen oxyCODONE-acetaminophen (PERCOCET) 7.5-325 MG per tablet Take 1 tablet by mouth every 6 (six) hours as needed.        . roflumilast (DALIRESP) 500 MCG TABS tablet Take 1 tablet (500 mcg total) by mouth daily.  30 tablet  6  . SPIRIVA HANDIHALER 18 MCG inhalation capsule inhale contents of 1 capsule daily  30 each  6  . temazepam (RESTORIL) 7.5 MG capsule Take 15 mg by mouth at bedtime as needed.      . traZODone (DESYREL) 50 MG tablet Take 50 mg by mouth 2 (two) times daily.        Marland Kitchen  fluticasone (FLONASE) 50 MCG/ACT nasal spray Place 2 sprays into the nose daily as needed.       Marland Kitchen amLODipine-olmesartan (AZOR) 5-40 MG per tablet Take 1 tablet by mouth daily.        . naproxen (NAPROSYN) 500 MG tablet Take 500 mg by mouth 2 (two) times daily as needed.       . predniSONE (DELTASONE) 10 MG tablet Take 4 daily for 5 days  20 tablet  0      Review of Systems  Constitutional:   No  weight loss, night sweats,  Fevers, chills, fatigue, lassitude. HEENT:   No headaches,  Difficulty swallowing,  Tooth/dental problems,  Sore throat,                No sneezing, itching, ear ache, +nasal congestion, post nasal drip,   CV:  No chest pain,  Orthopnea, PND, swelling in lower extremities, anasarca, dizziness, palpitations  GI  occ heartburn, indigestion, no abdominal pain, nausea, vomiting, diarrhea, change in bowel habits, loss of appetite  Resp: notes  shortness of breath with exertion not  at rest.   No coughing up of blood.    No chest wall deformity  Skin: no rash or lesions.  GU: no dysuria, change in color of urine, no urgency or  frequency.  No flank pain.  MS:  No joint pain or swelling.  No decreased range of motion.  No back pain.  Psych:  No change in mood or affect. No depression or anxiety.  No memory loss.     Objective:   Physical Exam BP 158/78  Pulse 89  Temp 98.2 F (36.8 C) (Oral)  Ht 5\' 9"  (1.753 m)  Wt 182 lb 9.6 oz (82.827 kg)  BMI 26.97 kg/m2  SpO2 92%  Gen: Pleasant, well-nourished, in no distress,  normal affect  ENT: No lesions,  mouth clear,  oropharynx clear, no postnasal drip  Neck: No JVD, no TMG, no carotid bruits  Lungs: coarse BS  , diminshed in bases , exp wheezes   Cardiovascular: RRR, heart sounds normal, no murmur or gallops, no peripheral edema  Abdomen: soft and NT, no HSM,  BS normal  Musculoskeletal: No deformities, no cyanosis or clubbing  Neuro: alert, non focal  Skin: Warm, no lesions or rashes     PFTs 04/2007: FeV1 47% FeV1/FVC: 43%  DLCO 47% Spiro: 04/09/2012  FeV1 39% FeV1/FVC 54%    Assessment & Plan:  Golds C Copd Frequent exacerbations Chronic obstructive lung disease gold stage C. with frequent exacerbations Recent exacerbation now stable Plan Trial Breo one puff daily, use samples and free one month trial refill Stay on spiriva Stay on singulair Stay on Daliresp Flu vaccine

## 2012-04-11 NOTE — Progress Notes (Signed)
Quick Note:  Called, spoke with pt. Informed him of cxr results and recs per Dr. Wright. He verbalized understanding and voiced no further questions/concerns at this time. ______ 

## 2012-05-17 ENCOUNTER — Telehealth: Payer: Self-pay | Admitting: Critical Care Medicine

## 2012-05-17 MED ORDER — AMOXICILLIN-POT CLAVULANATE 875-125 MG PO TABS: 1.0000 | ORAL_TABLET | Freq: Two times a day (BID) | ORAL | Status: DC

## 2012-05-17 NOTE — Telephone Encounter (Signed)
I spoke with spouse. Pt c/o cough w/ green-yellow phlem, PND, nasal congestion, sinus pressure x 4 days. No f/c/s/n/v/chest tx/wheezing. He has not taking anything OTC. He is currently in [place] and will be there for 2 weeks. Spouse is leaving  to go see him. She is requesting an ABX to be called in to take to him. Please advise Dr. Delford Field thanks [facility] No Known Allergies

## 2012-05-17 NOTE — Telephone Encounter (Signed)
lmomtcb x1--rx has been sent 

## 2012-05-17 NOTE — Telephone Encounter (Signed)
Call in augmentin 875mg bid x 7 days  

## 2012-05-20 NOTE — Telephone Encounter (Signed)
I checked with pharmacy and pt picked up meds. Carron Curie, CMA

## 2012-05-29 HISTORY — PX: UMBILICAL HERNIA REPAIR: SHX196

## 2012-06-21 ENCOUNTER — Ambulatory Visit (INDEPENDENT_AMBULATORY_CARE_PROVIDER_SITE_OTHER): Payer: Medicaid Other | Admitting: Critical Care Medicine

## 2012-06-21 ENCOUNTER — Other Ambulatory Visit (INDEPENDENT_AMBULATORY_CARE_PROVIDER_SITE_OTHER): Payer: Medicaid Other

## 2012-06-21 ENCOUNTER — Encounter: Payer: Self-pay | Admitting: Critical Care Medicine

## 2012-06-21 VITALS — BP 142/76 | HR 88 | Temp 98.0°F | Ht 69.0 in | Wt 187.8 lb

## 2012-06-21 DIAGNOSIS — R252 Cramp and spasm: Secondary | ICD-10-CM

## 2012-06-21 DIAGNOSIS — J441 Chronic obstructive pulmonary disease with (acute) exacerbation: Secondary | ICD-10-CM

## 2012-06-21 LAB — MAGNESIUM: Magnesium: 2.1 mg/dL (ref 1.5–2.5)

## 2012-06-21 LAB — CALCIUM: Calcium: 9.2 mg/dL (ref 8.4–10.5)

## 2012-06-21 LAB — BASIC METABOLIC PANEL
Chloride: 105 mEq/L (ref 96–112)
GFR: 78.56 mL/min (ref >60.00)
Potassium: 4 mEq/L (ref 3.5–5.1)
Sodium: 140 mEq/L (ref 135–145)

## 2012-06-21 NOTE — Progress Notes (Signed)
Quick Note:  Call pt and tell him labs are ok, His potassium, magnesium, calcium levels are all ok. I am unclear why he has muscle cramps. IT is not related to his lungs or lung medications. He needs to discuss with his primary care MD this issue.  ______

## 2012-06-21 NOTE — Assessment & Plan Note (Signed)
Chronic obstructive lung disease gold stage C. with frequent exacerbations status post last acute exacerbation December 2013 Plan Maintain on [employer] [facility]	[place] inhaled medications as prescribed Obtain a being met, calcium, magnesium levels in view of the patient's cramping of muscles

## 2012-06-21 NOTE — Progress Notes (Signed)
Subjective:    Patient ID: Nicholas Brady, male    DOB: 12/26/1950, 62 y.o.   MRN: [id number]  HPI  61 y.o.   white male with advanced chronic obstructive lung disease   04/09/2012 At last ov we started Daliresp . Pt notes more grey mucus.  Phlegm started to turn yellow.  No is off white .  Pt was in [place] all summer and had to take travel doxy/pred and finished 1.5 weeks ago. Mucus was yellow now is gray to white.  Notes some wheezing.  Pt notes more dyspnea now compared to before. Has proair now.   06/21/2012 SInce last OV :  Called while in [place] for ABX (augmentin x7days)   Then returned after first of year.  Now not much cough.  Mucus is white now and some pndrip.  Pt did have a productive cough of grey mucus.  C/o chronic muscle cramps No real wheeze. Pt denies any significant sore throat, nasal congestion or excess secretions, fever, chills, sweats, unintended weight loss, pleurtic or exertional chest pain, orthopnea PND, or leg swelling Pt denies any increase in rescue therapy over baseline, denies waking up needing it or having any early am or nocturnal exacerbations of coughing/wheezing/or dyspnea. Pt also denies any obvious fluctuation in symptoms with  weather or environmental change or other alleviating or aggravating factors     Past Medical History  Diagnosis Date  . Chronic airway obstruction, not elsewhere classified   . Unspecified essential hypertension   . Allergic rhinitis, cause unspecified   . Esophageal reflux      Family History  Problem Relation Age of Onset  . Emphysema       History   Social History  . Marital Status: Divorced    Spouse Name: N/A    Number of Children: N/A  . Years of Education: N/A   Occupational History  . Disabled    Social History Main Topics  . Smoking status: Former Smoker -- 4.0 packs/day for 45 years    Types: Cigarettes    Quit date: 06/29/2006  . Smokeless tobacco: Never Used  . Alcohol Use: No  . Drug Use:  Not on file  . Sexually Active: Not on file   Other Topics Concern  . Not on file   Social History Narrative  . No narrative on file     No Known Allergies   Outpatient Prescriptions Prior to Visit  Medication Sig Dispense Refill  . albuterol (PROVENTIL) (2.5 MG/3ML) 0.083% nebulizer solution Take 2.5 mg by nebulization every 6 (six) hours as needed for wheezing.  360 mL  5  . albuterol (VENTOLIN HFA) 108 (90 BASE) MCG/ACT inhaler inhale 1 to 2 puffs every 4 to 6 hours if needed  18 g  6  . alfuzosin (UROXATRAL) 10 MG 24 hr tablet Take 10 mg by mouth daily.        Marland Kitchen amLODipine-valsartan (EXFORGE) 5-160 MG per tablet Take 1 tablet by mouth daily.      . cyclobenzaprine (FLEXERIL) 10 MG tablet Take 10 mg by mouth daily as needed.       Marland Kitchen esomeprazole (NEXIUM) 40 MG capsule Take 40 mg by mouth daily before breakfast.        . fluticasone (FLONASE) 50 MCG/ACT nasal spray Place 2 sprays into the nose daily as needed.  16 g  6  . Fluticasone Furoate-Vilanterol (BREO ELLIPTA) 100-25 MCG/INH AEPB Inhale 1 puff into the lungs daily.  30 each  1  .  gabapentin (NEURONTIN) 300 MG capsule Take 300 mg by mouth 2 (two) times daily.      . montelukast (SINGULAIR) 10 MG tablet Take 10 mg by mouth at bedtime.        Marland Kitchen oxyCODONE-acetaminophen (PERCOCET) 7.5-325 MG per tablet Take 1 tablet by mouth every 6 (six) hours as needed.        . roflumilast (DALIRESP) 500 MCG TABS tablet Take 1 tablet (500 mcg total) by mouth daily.  30 tablet  6  . SPIRIVA HANDIHALER 18 MCG inhalation capsule inhale contents of 1 capsule daily  30 each  6  . temazepam (RESTORIL) 7.5 MG capsule Take 15 mg by mouth at bedtime as needed.      . traZODone (DESYREL) 50 MG tablet Take 50 mg by mouth 2 (two) times daily.        . [DISCONTINUED] amoxicillin-clavulanate (AUGMENTIN) 875-125 MG per tablet Take 1 tablet by mouth 2 (two) times daily.  14 tablet  0  Last reviewed on 06/21/2012  1:49 PM by Storm Frisk, MD    Review of  Systems  Constitutional:   No  weight loss, night sweats,  Fevers, chills, fatigue, lassitude. HEENT:   No headaches,  Difficulty swallowing,  Tooth/dental problems,  Sore throat,                No sneezing, itching, ear ache, +nasal congestion, post nasal drip,   CV:  No chest pain,  Orthopnea, PND, swelling in lower extremities, anasarca, dizziness, palpitations  GI  occ heartburn, indigestion, no abdominal pain, nausea, vomiting, diarrhea, change in bowel habits, loss of appetite  Resp: notes  shortness of breath with exertion not  at rest.   No coughing up of blood.    No chest wall deformity  Skin: no rash or lesions.  GU: no dysuria, change in color of urine, no urgency or frequency.  No flank pain.  MS:  No joint pain or swelling.  No decreased range of motion.  No back pain.  +++cramps  Psych:  No change in mood or affect. No depression or anxiety.  No memory loss.     Objective:   Physical Exam BP 142/76  Pulse 88  Temp 98 F (36.7 C) (Oral)  Ht 5\' 9"  (1.753 m)  Wt 187 lb 12.8 oz (85.186 kg)  BMI 27.73 kg/m2  SpO2 97%  Gen: Pleasant, well-nourished, in no distress,  normal affect  ENT: No lesions,  mouth clear,  oropharynx clear, no postnasal drip  Neck: No JVD, no TMG, no carotid bruits  Lungs: coarse BS  , diminshed in bases , no wheezes   Cardiovascular: RRR, heart sounds normal, no murmur or gallops, no peripheral edema  Abdomen: soft and NT, no HSM,  BS normal  Musculoskeletal: No deformities, no cyanosis or clubbing  Neuro: alert, non focal  Skin: Warm, no lesions or rashes       Assessment & Plan:  Golds C Copd Frequent exacerbations Chronic obstructive lung disease gold stage C. with frequent exacerbations status post last acute exacerbation December 2013 Plan Maintain on [employer] [facility]	[place] inhaled medications as prescribed Obtain a being met, calcium, magnesium levels in view of the patient's cramping of muscles

## 2012-06-21 NOTE — Patient Instructions (Addendum)
Labs will be obtained today No other medication changes Return 2 months

## 2012-06-24 NOTE — Progress Notes (Signed)
Quick Note:  Called, spoke with pt. Informed him of lab results and recs per Dr. Delford Field. He verbalized understanding of this and is aware he should contact his PCP regarding the muscle cramp issue He voiced no further questions or concerns at this time. ______

## 2012-07-20 ENCOUNTER — Other Ambulatory Visit: Payer: Self-pay | Admitting: Critical Care Medicine

## 2012-08-01 ENCOUNTER — Other Ambulatory Visit: Payer: Self-pay | Admitting: Critical Care Medicine

## 2012-08-21 ENCOUNTER — Encounter: Payer: Self-pay | Admitting: Critical Care Medicine

## 2012-08-21 ENCOUNTER — Other Ambulatory Visit: Payer: Self-pay | Admitting: Critical Care Medicine

## 2012-08-21 ENCOUNTER — Ambulatory Visit (INDEPENDENT_AMBULATORY_CARE_PROVIDER_SITE_OTHER): Payer: Medicaid Other | Admitting: Critical Care Medicine

## 2012-08-21 VITALS — BP 152/90 | HR 114 | Temp 97.6°F | Ht 69.0 in | Wt 196.0 lb

## 2012-08-21 DIAGNOSIS — J441 Chronic obstructive pulmonary disease with (acute) exacerbation: Secondary | ICD-10-CM

## 2012-08-21 MED ORDER — PREDNISONE 10 MG PO TABS: ORAL_TABLET | ORAL | Status: DC

## 2012-08-21 MED ORDER — AZITHROMYCIN 250 MG PO TABS: 250.0000 mg | ORAL_TABLET | Freq: Every day | ORAL | Status: DC

## 2012-08-21 NOTE — Patient Instructions (Addendum)
Azithromycin 250mg  Take two once then one daily until gone Prednisone 10mg  Take 4 for two days three for two days two for two days one for two days Refills will be sent  Return 4 months

## 2012-08-21 NOTE — Progress Notes (Signed)
Subjective:    Patient ID: Nicholas Brady, male    DOB: Mar 06, 1951, 62 y.o.   MRN: [id number]  HPI  62 y.o.   white male with advanced chronic obstructive lung disease   04/09/2012 At last ov we started Daliresp . Pt notes more grey mucus.  Phlegm started to turn yellow.  No is off white .  Pt was in [place] all summer and had to take travel doxy/pred and finished 1.5 weeks ago. Mucus was yellow now is gray to white.  Notes some wheezing.  Pt notes more dyspnea now compared to before. Has proair now.   06/21/2012 SInce last OV :  Called while in [place] for ABX (augmentin x7days)   Then returned after first of year.  Now not much cough.  Mucus is white now and some pndrip.  Pt did have a productive cough of grey mucus.  C/o chronic muscle cramps No real wheeze. Pt denies any significant sore throat, nasal congestion or excess secretions, fever, chills, sweats, unintended weight loss, pleurtic or exertional chest pain, orthopnea PND, or leg swelling Pt denies any increase in rescue therapy over baseline, denies waking up needing it or having any early am or nocturnal exacerbations of coughing/wheezing/or dyspnea. Pt also denies any obvious fluctuation in symptoms with  weather or environmental change or other alleviating or aggravating factors  08/21/2012 With cold weather is worse , now coughing gray mucus.  Notes black stools.  No real chest pain. Notes some tightness at night. No edema in feet.  Notes some sinus drip     Past Medical History  Diagnosis Date  . Chronic airway obstruction, not elsewhere classified   . Unspecified essential hypertension   . Allergic rhinitis, cause unspecified   . Esophageal reflux      Family History  Problem Relation Age of Onset  . Emphysema       History   Social History  . Marital Status: Divorced    Spouse Name: N/A    Number of Children: N/A  . Years of Education: N/A   Occupational History  . Disabled    Social History Main  Topics  . Smoking status: Former Smoker -- 4.00 packs/day for 45 years    Types: Cigarettes    Quit date: 06/29/2006  . Smokeless tobacco: Never Used  . Alcohol Use: No  . Drug Use: Not on file  . Sexually Active: Not on file   Other Topics Concern  . Not on file   Social History Narrative  . No narrative on file     No Known Allergies   Outpatient Prescriptions Prior to Visit  Medication Sig Dispense Refill  . albuterol (PROVENTIL) (2.5 MG/3ML) 0.083% nebulizer solution Take 2.5 mg by nebulization every 6 (six) hours as needed for wheezing.  360 mL  5  . alfuzosin (UROXATRAL) 10 MG 24 hr tablet Take 10 mg by mouth daily.        Marland Kitchen amLODipine-valsartan (EXFORGE) 5-160 MG per tablet Take 1 tablet by mouth daily.      Marland Kitchen BREO ELLIPTA 100-25 MCG/INH AEPB inhale 1 puff daily  60 each  2  . cyclobenzaprine (FLEXERIL) 10 MG tablet Take 10 mg by mouth 3 (three) times daily.       Marland Kitchen esomeprazole (NEXIUM) 40 MG capsule Take 40 mg by mouth daily before breakfast.        . fluticasone (FLONASE) 50 MCG/ACT nasal spray Place 2 sprays into the nose daily as needed.  16 g  6  . gabapentin (NEURONTIN) 300 MG capsule Take 600 mg by mouth 3 (three) times daily.       . montelukast (SINGULAIR) 10 MG tablet Take 10 mg by mouth at bedtime.        Marland Kitchen oxyCODONE-acetaminophen (PERCOCET) 7.5-325 MG per tablet Take 1 tablet by mouth every 6 (six) hours as needed.        . roflumilast (DALIRESP) 500 MCG TABS tablet Take 1 tablet by mouth daily  30 tablet  6  . SPIRIVA HANDIHALER 18 MCG inhalation capsule inhale contents of 1 capsule daily  30 each  6  . traZODone (DESYREL) 50 MG tablet Take 50 mg by mouth 2 (two) times daily.        Marland Kitchen albuterol (VENTOLIN HFA) 108 (90 BASE) MCG/ACT inhaler inhale 1 to 2 puffs every 4 to 6 hours if needed  18 g  6  . temazepam (RESTORIL) 7.5 MG capsule Take 15 mg by mouth at bedtime as needed.       No facility-administered medications prior to visit.      Review of  Systems  Constitutional:   No  weight loss, night sweats,  Fevers, chills, fatigue, lassitude. HEENT:   No headaches,  Difficulty swallowing,  Tooth/dental problems,  Sore throat,                No sneezing, itching, ear ache, +nasal congestion, post nasal drip,   CV:  No chest pain,  Orthopnea, PND, swelling in lower extremities, anasarca, dizziness, palpitations  GI  occ heartburn, indigestion, no abdominal pain, nausea, vomiting, diarrhea, change in bowel habits, loss of appetite  Resp: notes  shortness of breath with exertion not  at rest.   No coughing up of blood.    No chest wall deformity  Skin: no rash or lesions.  GU: no dysuria, change in color of urine, no urgency or frequency.  No flank pain.  MS:  No joint pain or swelling.  No decreased range of motion.  No back pain.  +++cramps  Psych:  No change in mood or affect. No depression or anxiety.  No memory loss.     Objective:   Physical Exam BP 152/90  Pulse 114  Temp(Src) 97.6 F (36.4 C) (Oral)  Ht 5\' 9"  (1.753 m)  Wt 196 lb (88.905 kg)  BMI 28.93 kg/m2  SpO2 96%  Gen: Pleasant, well-nourished, in no distress,  normal affect  ENT: No lesions,  mouth clear,  oropharynx clear, no postnasal drip  Neck: No JVD, no TMG, no carotid bruits  Lungs: coarse BS  , diminshed in bases , expired wheezes   Cardiovascular: RRR, heart sounds normal, no murmur or gallops, no peripheral edema  Abdomen: soft and NT, no HSM,  BS normal  Musculoskeletal: No deformities, no cyanosis or clubbing  Neuro: alert, non focal  Skin: Warm, no lesions or rashes       Assessment & Plan:  Golds C Copd Frequent exacerbations Gold stage C. COPD with frequent exacerbations Plan Azithromycin 250mg  Take two once then one daily until gone Prednisone 10mg  Take 4 for two days three for two days two for two days one for two days Refills will be sent  Return 4 months

## 2012-08-22 NOTE — Assessment & Plan Note (Signed)
Gold stage C. COPD with frequent exacerbations Plan Azithromycin 250mg  Take two once then one daily until gone Prednisone 10mg  Take 4 for two days three for two days two for two days one for two days Refills will be sent  Return 4 months

## 2012-09-24 ENCOUNTER — Other Ambulatory Visit: Payer: Self-pay | Admitting: Critical Care Medicine

## 2012-10-20 ENCOUNTER — Other Ambulatory Visit: Payer: Self-pay | Admitting: Critical Care Medicine

## 2012-11-15 ENCOUNTER — Telehealth: Payer: Self-pay | Admitting: Critical Care Medicine

## 2012-11-15 NOTE — Telephone Encounter (Signed)
°  Called patient and left messages x3. Sent letter 11/15/12 °

## 2012-11-28 ENCOUNTER — Telehealth: Payer: Self-pay | Admitting: *Deleted

## 2012-11-28 NOTE — Telephone Encounter (Signed)
Received faxed Prior Auth request on Breo from [facility] in [place] on [address] [place]. Called [phone], spoke with Tresa Endo. PA is being routed for review. Confirmation # is [id number] P Per Tresa Endo, will receive response in 24 hours. Will await response.

## 2012-12-11 NOTE — Telephone Encounter (Signed)
Response has not been received yet. Called [facility], spoke with [employer]. Was advised Virgel Bouquet is APPROVED from 11/28/12 - 11/23/13 Approval  # [id number]  -----  Hassell Done with [facility] aware of approval and states pt picked up rx yesterday. Called, spoke with pt to ensure this.  Pt is aware of approval.  He was last seen by PW in March and was asked to f/u in 4 months.  Pt states he will be in town next week and would like to schedule f/t OV with PW during this time.  OV scheduled for July 23 at 4:15 pm at [facility] -- pt aware and voiced no further questions or concerns at this time.

## 2012-12-18 ENCOUNTER — Ambulatory Visit: Payer: Medicaid Other | Admitting: Critical Care Medicine

## 2013-01-22 ENCOUNTER — Other Ambulatory Visit: Payer: Self-pay | Admitting: Critical Care Medicine

## 2013-02-11 ENCOUNTER — Ambulatory Visit (INDEPENDENT_AMBULATORY_CARE_PROVIDER_SITE_OTHER): Payer: Medicaid Other | Admitting: Critical Care Medicine

## 2013-02-11 ENCOUNTER — Ambulatory Visit (INDEPENDENT_AMBULATORY_CARE_PROVIDER_SITE_OTHER)
Admission: RE | Admit: 2013-02-11 | Discharge: 2013-02-11 | Disposition: A | Discharge status: Home / Self Care | Payer: Medicaid Other | Source: Ambulatory Visit | Attending: Critical Care Medicine | Admitting: Critical Care Medicine

## 2013-02-11 ENCOUNTER — Encounter: Payer: Self-pay | Admitting: Critical Care Medicine

## 2013-02-11 VITALS — BP 150/98 | HR 74 | Temp 98.1°F | Ht 69.0 in | Wt 180.4 lb

## 2013-02-11 DIAGNOSIS — J439 Emphysema, unspecified: Secondary | ICD-10-CM

## 2013-02-11 DIAGNOSIS — J920 Pleural plaque with presence of asbestos: Secondary | ICD-10-CM

## 2013-02-11 DIAGNOSIS — J438 Other emphysema: Secondary | ICD-10-CM

## 2013-02-11 DIAGNOSIS — T65891A Toxic effect of other specified substances, accidental (unintentional), initial encounter: Secondary | ICD-10-CM

## 2013-02-11 DIAGNOSIS — R091 Pleurisy: Secondary | ICD-10-CM

## 2013-02-11 DIAGNOSIS — J441 Chronic obstructive pulmonary disease with (acute) exacerbation: Secondary | ICD-10-CM

## 2013-02-11 DIAGNOSIS — Z23 Encounter for immunization: Secondary | ICD-10-CM

## 2013-02-11 MED ORDER — PREDNISONE 10 MG PO TABS: ORAL_TABLET | ORAL | Status: DC

## 2013-02-11 NOTE — Progress Notes (Signed)
Subjective:    Patient ID: Nicholas Brady, male    DOB: 11/16/1950, 62 y.o.   MRN: [id number]  HPI  62 y.o.   white male with advanced chronic obstructive lung disease     02/11/2013 Chief Complaint  Patient presents with  . Acute Visit    increased SOB x 1 month, PND, and cough with small amount of clear mucus.    Notes over one month more dyspnea.  Pndrip.  Mucus is clear. No yellow or green.  No real chest pain.  No real wheeze.  Notes some Qhs dyspnea. Pt on daliresp/spiriva/breo.     Past Medical History  Diagnosis Date  . Chronic airway obstruction, not elsewhere classified   . Unspecified essential hypertension   . Allergic rhinitis, cause unspecified   . Esophageal reflux      Family History  Problem Relation Age of Onset  . Emphysema       History   Social History  . Marital Status: Divorced    Spouse Name: N/A    Number of Children: N/A  . Years of Education: N/A   Occupational History  . Disabled    Social History Main Topics  . Smoking status: Former Smoker -- 4.00 packs/day for 45 years    Types: Cigarettes    Quit date: 06/29/2006  . Smokeless tobacco: Never Used  . Alcohol Use: No  . Drug Use: Not on file  . Sexual Activity: Not on file   Other Topics Concern  . Not on file   Social History Narrative  . No narrative on file     No Known Allergies   Outpatient Prescriptions Prior to Visit  Medication Sig Dispense Refill  . albuterol (PROVENTIL) (2.5 MG/3ML) 0.083% nebulizer solution Take 2.5 mg by nebulization every 6 (six) hours as needed for wheezing.  360 mL  5  . alfuzosin (UROXATRAL) 10 MG 24 hr tablet Take 10 mg by mouth daily.        Marland Kitchen amLODipine-valsartan (EXFORGE) 5-160 MG per tablet Take 1 tablet by mouth daily.      Marland Kitchen BREO ELLIPTA 100-25 MCG/INH AEPB inhale 1 puff daily  60 each  3  . cyclobenzaprine (FLEXERIL) 10 MG tablet Take 10 mg by mouth 3 (three) times daily.       Marland Kitchen esomeprazole (NEXIUM) 40 MG capsule Take 40 mg  by mouth daily before breakfast.        . fluticasone (FLONASE) 50 MCG/ACT nasal spray Place 2 sprays into the nose daily as needed.  16 g  6  . gabapentin (NEURONTIN) 300 MG capsule Take 600 mg by mouth 3 (three) times daily.       . montelukast (SINGULAIR) 10 MG tablet Take 10 mg by mouth at bedtime.        Marland Kitchen oxyCODONE-acetaminophen (PERCOCET) 7.5-325 MG per tablet Take 1 tablet by mouth every 6 (six) hours as needed.        Marland Kitchen PROAIR HFA 108 (90 BASE) MCG/ACT inhaler inhale 1 to 2 puffs every 4 to 6 hours if needed  18 g  6  . roflumilast (DALIRESP) 500 MCG TABS tablet Take 1 tablet by mouth daily  30 tablet  6  . SPIRIVA HANDIHALER 18 MCG inhalation capsule inhale the contents of one capsule in the handihaler once daily  30 capsule  6  . traZODone (DESYREL) 50 MG tablet Take 50 mg by mouth 2 (two) times daily.        Marland Kitchen zolpidem (  AMBIEN) 10 MG tablet Take 10 mg by mouth at bedtime.      Marland Kitchen azithromycin (ZITHROMAX) 250 MG tablet Take 1 tablet (250 mg total) by mouth daily. Take two once then one daily until gone  6 each  0  . predniSONE (DELTASONE) 10 MG tablet Take 4 for two days three for two days two for two days one for two days  20 tablet  0   No facility-administered medications prior to visit.      Review of Systems  Constitutional:   No  weight loss, night sweats,  Fevers, chills, fatigue, lassitude. HEENT:   No headaches,  Difficulty swallowing,  Tooth/dental problems,  Sore throat,                No sneezing, itching, ear ache, +nasal congestion, post nasal drip,   CV:  No chest pain,  Orthopnea, PND, swelling in lower extremities, anasarca, dizziness, palpitations  GI  occ heartburn, indigestion, no abdominal pain, nausea, vomiting, diarrhea, change in bowel habits, loss of appetite  Resp: notes  shortness of breath with exertion not  at rest.   No coughing up of blood.    No chest wall deformity  Skin: no rash or lesions.  GU: no dysuria, change in color of urine, no  urgency or frequency.  No flank pain.  MS:  No joint pain or swelling.  No decreased range of motion.  No back pain.  +++cramps  Psych:  No change in mood or affect. No depression or anxiety.  No memory loss.     Objective:   Physical Exam BP 150/98  Pulse 74  Temp(Src) 98.1 F (36.7 C) (Oral)  Ht 5\' 9"  (1.753 m)  Wt 81.829 kg (180 lb 6.4 oz)  BMI 26.63 kg/m2  SpO2 95%  Gen: Pleasant, well-nourished, in no distress,  normal affect  ENT: No lesions,  mouth clear,  oropharynx clear, no postnasal drip  Neck: No JVD, no TMG, no carotid bruits  Lungs: coarse BS  , diminshed in bases , expired wheezes   Cardiovascular: RRR, heart sounds normal, no murmur or gallops, no peripheral edema  Abdomen: soft and NT, no HSM,  BS normal  Musculoskeletal: No deformities, no cyanosis or clubbing  Neuro: alert, non focal  Skin: Warm, no lesions or rashes       Assessment & Plan:  Golds C Copd Frequent exacerbations Gold C Copd with freq exac despite Breo/Spiriva/Daliresp Plan Prednisone 10mg  Take 4 tablets daily for 5 days then stop Sent to pharmacy A Flu vaccine was given No change in medications

## 2013-02-11 NOTE — Patient Instructions (Addendum)
Prednisone 10mg  Take 4 tablets daily for 5 days then stop Sent to pharmacy A Flu vaccine was given No change in medications A Chest xray was obtained Return 2 months

## 2013-02-12 NOTE — Assessment & Plan Note (Signed)
Gold C Copd with freq exac despite Breo/Spiriva/Daliresp Plan Prednisone 10mg  Take 4 tablets daily for 5 days then stop Sent to pharmacy A Flu vaccine was given No change in medications

## 2013-02-12 NOTE — Progress Notes (Signed)
Quick Note:  Called, spoke with pt. Informed him of cxr results per Dr. Wright. He verbalized understanding. ______ 

## 2013-02-28 ENCOUNTER — Other Ambulatory Visit: Payer: Self-pay | Admitting: Critical Care Medicine

## 2013-03-07 DIAGNOSIS — Q792 Exomphalos: Secondary | ICD-10-CM | POA: Insufficient documentation

## 2013-04-11 DIAGNOSIS — IMO0002 Reserved for concepts with insufficient information to code with codable children: Secondary | ICD-10-CM | POA: Insufficient documentation

## 2013-04-15 ENCOUNTER — Telehealth: Payer: Self-pay | Admitting: Critical Care Medicine

## 2013-04-15 NOTE — Telephone Encounter (Signed)
LM to schedule apt x3. No return call back. Sent letter 04/15/13.  °

## 2013-05-01 ENCOUNTER — Other Ambulatory Visit: Payer: Self-pay | Admitting: Critical Care Medicine

## 2013-05-01 ENCOUNTER — Ambulatory Visit (INDEPENDENT_AMBULATORY_CARE_PROVIDER_SITE_OTHER): Payer: Medicaid Other | Admitting: Critical Care Medicine

## 2013-05-01 ENCOUNTER — Encounter: Payer: Self-pay | Admitting: Critical Care Medicine

## 2013-05-01 VITALS — BP 140/90 | HR 71 | Temp 98.1°F | Ht 69.0 in | Wt 189.0 lb

## 2013-05-01 DIAGNOSIS — J441 Chronic obstructive pulmonary disease with (acute) exacerbation: Secondary | ICD-10-CM

## 2013-05-01 MED ORDER — AZITHROMYCIN 250 MG PO TABS: 250.0000 mg | ORAL_TABLET | Freq: Every day | ORAL | Status: DC

## 2013-05-01 NOTE — Patient Instructions (Signed)
Azithromycin 250mg  Take two once then one daily until gone No other changes in medications Return 4 months

## 2013-05-01 NOTE — Progress Notes (Signed)
Subjective:    Patient ID: Nicholas Brady, male    DOB: 1950-08-20, 62 y.o.   MRN: [id number]  HPI  62 y.o.   white male with advanced chronic obstructive lung disease    05/01/2013 Chief Complaint  Patient presents with  . COPD    follow-up. Pt is c/o increased productive cough with clear phelgm, and  increased SOB with activity x 3 weeks.   Pt notes more dyspnea and cough x 3 weeks. Mucus now is off white , not dark.  Notes mild sinus drainage.  Pt has a dry headache.  Notes mild chest pain .   Notes mild wheeze.  No blood.    Past Medical History  Diagnosis Date  . Chronic airway obstruction, not elsewhere classified   . Unspecified essential hypertension   . Allergic rhinitis, cause unspecified   . Esophageal reflux      Family History  Problem Relation Age of Onset  . Emphysema       History   Social History  . Marital Status: Divorced    Spouse Name: N/A    Number of Children: N/A  . Years of Education: N/A   Occupational History  . Disabled    Social History Main Topics  . Smoking status: Former Smoker -- 4.00 packs/day for 45 years    Types: Cigarettes    Quit date: 06/29/2006  . Smokeless tobacco: Never Used  . Alcohol Use: No  . Drug Use: Not on file  . Sexual Activity: Not on file   Other Topics Concern  . Not on file   Social History Narrative  . No narrative on file     No Known Allergies   Outpatient Prescriptions Prior to Visit  Medication Sig Dispense Refill  . albuterol (PROVENTIL) (2.5 MG/3ML) 0.083% nebulizer solution Take 2.5 mg by nebulization every 6 (six) hours as needed for wheezing.  360 mL  5  . alfuzosin (UROXATRAL) 10 MG 24 hr tablet Take 10 mg by mouth daily.        Marland Kitchen amLODipine-valsartan (EXFORGE) 5-160 MG per tablet Take 1 tablet by mouth daily.      Marland Kitchen BREO ELLIPTA 100-25 MCG/INH AEPB inhale 1 puff daily  60 each  3  . cyclobenzaprine (FLEXERIL) 10 MG tablet Take 10 mg by mouth 3 (three) times daily.       Marland Kitchen DALIRESP  500 MCG TABS tablet take 1 tablet by mouth daily  30 tablet  6  . esomeprazole (NEXIUM) 40 MG capsule Take 40 mg by mouth daily before breakfast.        . fluticasone (FLONASE) 50 MCG/ACT nasal spray Place 2 sprays into the nose daily as needed.  16 g  6  . gabapentin (NEURONTIN) 300 MG capsule Take 600 mg by mouth 3 (three) times daily.       . montelukast (SINGULAIR) 10 MG tablet Take 10 mg by mouth at bedtime.        Marland Kitchen oxyCODONE-acetaminophen (PERCOCET) 7.5-325 MG per tablet Take 1 tablet by mouth every 6 (six) hours as needed.        Marland Kitchen PROAIR HFA 108 (90 BASE) MCG/ACT inhaler inhale 1 to 2 puffs every 4 to 6 hours if needed  18 g  6  . SPIRIVA HANDIHALER 18 MCG inhalation capsule inhale the contents of one capsule in the handihaler once daily  30 capsule  6  . traZODone (DESYREL) 50 MG tablet Take 50 mg by mouth 2 (two) times daily.        Marland Kitchen  zolpidem (AMBIEN) 10 MG tablet Take 10 mg by mouth at bedtime.      . predniSONE (DELTASONE) 10 MG tablet Take 4 tablets daily for 5 days then stop  20 tablet  0   No facility-administered medications prior to visit.      Review of Systems  Constitutional:   No  weight loss, night sweats,  Fevers, chills, fatigue, lassitude. HEENT:   No headaches,  Difficulty swallowing,  Tooth/dental problems,  Sore throat,                No sneezing, itching, ear ache, +nasal congestion, post nasal drip,   CV:  No chest pain,  Orthopnea, PND, swelling in lower extremities, anasarca, dizziness, palpitations  GI  occ heartburn, indigestion, no abdominal pain, nausea, vomiting, diarrhea, change in bowel habits, loss of appetite  Resp: notes  shortness of breath with exertion not  at rest.   No coughing up of blood.    No chest wall deformity  Skin: no rash or lesions.  GU: no dysuria, change in color of urine, no urgency or frequency.  No flank pain.  MS:  No joint pain or swelling.  No decreased range of motion.  No back pain.  +++cramps  Psych:  No change  in mood or affect. No depression or anxiety.  No memory loss.     Objective:   Physical Exam BP 140/90  Pulse 71  Temp(Src) 98.1 F (36.7 C) (Oral)  Ht 5\' 9"  (1.753 m)  Wt 189 lb (85.73 kg)  BMI 27.90 kg/m2  SpO2 95%  Gen: Pleasant, well-nourished, in no distress,  normal affect  ENT: No lesions,  mouth clear,  oropharynx clear, no postnasal drip  Neck: No JVD, no TMG, no carotid bruits  Lungs: coarse BS  , diminshed in bases , no wheezes   Cardiovascular: RRR, heart sounds normal, no murmur or gallops, no peripheral edema  Abdomen: soft and NT, no HSM,  BS normal  Musculoskeletal: No deformities, no cyanosis or clubbing  Neuro: alert, non focal  Skin: Warm, no lesions or rashes       Assessment & Plan:  Golds C Copd Frequent exacerbations Moderate COPD with primary emphysematous component gold stage C. with frequent exacerbations Mild re exacerbation currently Plan Azithromycin for 5 days Maintain Daliresp, Spiriva, and Breo

## 2013-05-01 NOTE — Assessment & Plan Note (Signed)
Moderate COPD with primary emphysematous component gold stage C. with frequent exacerbations Mild re exacerbation currently Plan Azithromycin for 5 days Maintain Daliresp, Spiriva, and Breo

## 2013-06-16 ENCOUNTER — Telehealth: Payer: Self-pay | Admitting: Critical Care Medicine

## 2013-06-16 NOTE — Telephone Encounter (Signed)
LMTCB

## 2013-06-17 NOTE — Telephone Encounter (Signed)
Start Zpak. If unimproved, needs OV

## 2013-06-17 NOTE — Telephone Encounter (Signed)
[email or web address]. Evanee Lubrano, CMA  

## 2013-06-17 NOTE — Telephone Encounter (Signed)
Pt is aware of recs. Nothing further needed 

## 2013-06-17 NOTE — Telephone Encounter (Signed)
Pt's spouse is returning call & can be reached at  [phone].  Antionette FairyHolly D Pryor

## 2013-06-17 NOTE — Telephone Encounter (Signed)
Called and spoke with pt. C/o non productive cough, when coughs ribs hurt, nasal congestion, PND. Denies any no wheezing, no chest tx, no fever, no chills, no sweats. He has on hand ZPAK from last OV 04/2013 with Dr. Delford FieldWright. He wants to know if he should start this. Also spoke with spouse and she did not feel he needed an OV. I offered TP this afternoon in [place] and PW in [place]. Please advise Dr. Delford FieldWright thanks  No Known Allergies

## 2013-07-29 ENCOUNTER — Other Ambulatory Visit: Payer: Self-pay | Admitting: Critical Care Medicine

## 2013-08-08 ENCOUNTER — Encounter (INDEPENDENT_AMBULATORY_CARE_PROVIDER_SITE_OTHER): Payer: Self-pay

## 2013-08-08 ENCOUNTER — Encounter: Payer: Self-pay | Admitting: Critical Care Medicine

## 2013-08-08 ENCOUNTER — Ambulatory Visit (INDEPENDENT_AMBULATORY_CARE_PROVIDER_SITE_OTHER): Payer: Medicaid Other | Admitting: Critical Care Medicine

## 2013-08-08 VITALS — BP 154/94 | HR 70 | Temp 98.3°F | Ht 69.0 in | Wt 193.3 lb

## 2013-08-08 DIAGNOSIS — J441 Chronic obstructive pulmonary disease with (acute) exacerbation: Secondary | ICD-10-CM

## 2013-08-08 MED ORDER — AZITHROMYCIN 250 MG PO TABS: 250.0000 mg | ORAL_TABLET | Freq: Every day | ORAL | Status: DC

## 2013-08-08 MED ORDER — METHYLPREDNISOLONE ACETATE 80 MG/ML IJ SUSP
120.0000 mg | Freq: Once | INTRAMUSCULAR | Status: AC
Start: 2013-08-08 — End: 2013-08-08
  Administered 2013-08-08: 120 mg via INTRAMUSCULAR

## 2013-08-08 NOTE — Assessment & Plan Note (Signed)
A depomedrol injection 120mg  IM given Azithromycin 250mg  Take two once then one daily until gone was given Stay on Breo No other changes Return 2 months

## 2013-08-08 NOTE — Progress Notes (Signed)
Subjective:    Patient ID: Nicholas Brady, male    DOB: 1950/06/29, 63 y.o.   MRN: [id number]  HPI  63 y.o.   white male with advanced chronic obstructive lung disease   08/08/2013 Chief Complaint  Patient presents with  . Follow-up    Increased SOB with any activity or when walking x 1 month and increased cough with off white mucus.  Occas chest tightness.  No f/c/s.   Pt coughing more.  Notes more chest tightness.  Notes wheeze and dyspnea.    Past Medical History  Diagnosis Date  . Chronic airway obstruction, not elsewhere classified   . Unspecified essential hypertension   . Allergic rhinitis, cause unspecified   . Esophageal reflux      Family History  Problem Relation Age of Onset  . Emphysema       History   Social History  . Marital Status: Divorced    Spouse Name: N/A    Number of Children: N/A  . Years of Education: N/A   Occupational History  . Disabled    Social History Main Topics  . Smoking status: Former Smoker -- 4.00 packs/day for 45 years    Types: Cigarettes    Quit date: 06/29/2006  . Smokeless tobacco: Never Used  . Alcohol Use: No  . Drug Use: Not on file  . Sexual Activity: Not on file   Other Topics Concern  . Not on file   Social History Narrative  . No narrative on file     No Known Allergies   Outpatient Prescriptions Prior to Visit  Medication Sig Dispense Refill  . albuterol (PROVENTIL) (2.5 MG/3ML) 0.083% nebulizer solution Take 2.5 mg by nebulization every 6 (six) hours as needed for wheezing.  360 mL  5  . alfuzosin (UROXATRAL) 10 MG 24 hr tablet Take 10 mg by mouth daily.        Marland Kitchen amLODipine-valsartan (EXFORGE) 5-160 MG per tablet Take 1 tablet by mouth daily.      Marland Kitchen BREO ELLIPTA 100-25 MCG/INH AEPB take 1 inhalation by mouth once daily  60 each  3  . cyclobenzaprine (FLEXERIL) 10 MG tablet Take 10 mg by mouth 3 (three) times daily as needed.       Marland Kitchen DALIRESP 500 MCG TABS tablet take 1 tablet by mouth daily  30 tablet   6  . esomeprazole (NEXIUM) 40 MG capsule Take 40 mg by mouth daily before breakfast.        . fluticasone (FLONASE) 50 MCG/ACT nasal spray instill 2 sprays INTO NOSTRIL(S) daily if needed  16 g  6  . gabapentin (NEURONTIN) 300 MG capsule Take 600 mg by mouth 3 (three) times daily.       . montelukast (SINGULAIR) 10 MG tablet Take 10 mg by mouth at bedtime.        Marland Kitchen oxyCODONE-acetaminophen (PERCOCET) 7.5-325 MG per tablet Take 1 tablet by mouth every 6 (six) hours as needed.        Marland Kitchen PROAIR HFA 108 (90 BASE) MCG/ACT inhaler inhale 1 to 2 puffs every 4 to 6 hours if needed  18 g  6  . SPIRIVA HANDIHALER 18 MCG inhalation capsule inhale the contents of one capsule in the handihaler once daily  30 capsule  6  . traZODone (DESYREL) 50 MG tablet Take 50 mg by mouth 2 (two) times daily.        Marland Kitchen zolpidem (AMBIEN) 10 MG tablet Take 10 mg by mouth at bedtime.      Marland Kitchen  azithromycin (ZITHROMAX) 250 MG tablet Take 1 tablet (250 mg total) by mouth daily. Take two once then one daily until gone  6 each  0   No facility-administered medications prior to visit.      Review of Systems  Constitutional:   No  weight loss, night sweats,  Fevers, chills, fatigue, lassitude. HEENT:   No headaches,  Difficulty swallowing,  Tooth/dental problems,  Sore throat,                No sneezing, itching, ear ache, +nasal congestion, post nasal drip,   CV:  No chest pain,  Orthopnea, PND, swelling in lower extremities, anasarca, dizziness, palpitations  GI  occ heartburn, indigestion, no abdominal pain, nausea, vomiting, diarrhea, change in bowel habits, loss of appetite  Resp: notes  shortness of breath with exertion not  at rest.   No coughing up of blood.    No chest wall deformity  Skin: no rash or lesions.  GU: no dysuria, change in color of urine, no urgency or frequency.  No flank pain.  MS:  No joint pain or swelling.  No decreased range of motion.  No back pain.  +++cramps  Psych:  No change in mood or  affect. No depression or anxiety.  No memory loss.     Objective:   Physical Exam BP 154/94  Pulse 70  Temp(Src) 98.3 F (36.8 C) (Oral)  Ht 5\' 9"  (1.753 m)  Wt 193 lb 4.8 oz (87.68 kg)  BMI 28.53 kg/m2  SpO2 96%  Gen: Pleasant, well-nourished, in no distress,  normal affect  ENT: No lesions,  mouth clear,  oropharynx clear, no postnasal drip  Neck: No JVD, no TMG, no carotid bruits  Lungs: coarse BS  , diminshed in bases , exp wheezes   Cardiovascular: RRR, heart sounds normal, no murmur or gallops, no peripheral edema  Abdomen: soft and NT, no HSM,  BS normal  Musculoskeletal: No deformities, no cyanosis or clubbing  Neuro: alert, non focal  Skin: Warm, no lesions or rashes       Assessment & Plan:  Golds C Copd Frequent exacerbations A depomedrol injection 120mg  IM given Azithromycin 250mg  Take two once then one daily until gone was given Stay on Breo No other changes Return 2 months

## 2013-08-08 NOTE — Patient Instructions (Signed)
A depomedrol injection 120mg IM given Azithromycin 250mg Take two once then one daily until gone was given Stay on Breo No other changes Return 2 months  

## 2013-08-14 DIAGNOSIS — N400 Enlarged prostate without lower urinary tract symptoms: Secondary | ICD-10-CM | POA: Insufficient documentation

## 2013-09-24 ENCOUNTER — Telehealth: Payer: Self-pay | Admitting: Critical Care Medicine

## 2013-09-24 NOTE — Telephone Encounter (Signed)
[id number] 

## 2013-09-25 MED ORDER — PREDNISONE 10 MG PO TABS: ORAL_TABLET | ORAL | Status: DC

## 2013-09-25 MED ORDER — AZITHROMYCIN 250 MG PO TABS: ORAL_TABLET | ORAL | Status: DC

## 2013-09-25 NOTE — Telephone Encounter (Signed)
Spoke with the pt He is c/o increased cough x 1 wk- prod with moderate yellow to brown  He has also noticed minimal wheezing and increased SOB  No chest tightness or fever  I offered appt but he refused  He is leaving town in a few days to visit [place] and wants something called in  Please advise, thanks! No Known Allergies

## 2013-09-25 NOTE — Telephone Encounter (Signed)
Called spoke with pt. Aware of recs. RX's called in. Nothing further needed 

## 2013-09-25 NOTE — Telephone Encounter (Signed)
Call in:  Azithromycin 250mg Take two once then one daily until gone #6 Prednisone 10mg Take 4 for two days three for two days two for two days one for two days #20 

## 2013-09-27 ENCOUNTER — Other Ambulatory Visit: Payer: Self-pay | Admitting: Critical Care Medicine

## 2013-11-14 ENCOUNTER — Telehealth: Payer: Self-pay | Admitting: Critical Care Medicine

## 2013-11-14 NOTE — Telephone Encounter (Signed)
Called spoke with spouse. Made her aware PW had nothing available at all. She scheduled pt an appt to see TP Tuesday at 12 PM. Nothing further needed

## 2013-11-18 ENCOUNTER — Encounter: Payer: Self-pay | Admitting: Adult Health

## 2013-11-18 ENCOUNTER — Ambulatory Visit (INDEPENDENT_AMBULATORY_CARE_PROVIDER_SITE_OTHER): Payer: Medicaid Other | Admitting: Adult Health

## 2013-11-18 ENCOUNTER — Ambulatory Visit (INDEPENDENT_AMBULATORY_CARE_PROVIDER_SITE_OTHER)
Admission: RE | Admit: 2013-11-18 | Discharge: 2013-11-18 | Disposition: A | Discharge status: Home / Self Care | Payer: Medicaid Other | Source: Ambulatory Visit | Attending: Adult Health | Admitting: Adult Health

## 2013-11-18 VITALS — BP 132/84 | HR 89 | Temp 97.8°F | Ht 69.0 in | Wt 178.8 lb

## 2013-11-18 DIAGNOSIS — J441 Chronic obstructive pulmonary disease with (acute) exacerbation: Secondary | ICD-10-CM

## 2013-11-18 DIAGNOSIS — J9383 Other pneumothorax: Secondary | ICD-10-CM

## 2013-11-18 DIAGNOSIS — J939 Pneumothorax, unspecified: Secondary | ICD-10-CM

## 2013-11-18 NOTE — Assessment & Plan Note (Signed)
CXR today shows no reoccurence of PTX  Cont w/ current regimen

## 2013-11-18 NOTE — Assessment & Plan Note (Signed)
Stable continue on current regimen .  

## 2013-11-18 NOTE — Patient Instructions (Signed)
Continue on current regimen  Chest xray today .  Follow up Dr. Delford FieldWright  In 2-3 weeks and As needed   Please contact office for sooner follow up if symptoms do not improve or worsen or seek emergency care

## 2013-11-18 NOTE — Progress Notes (Signed)
Subjective:    Patient ID: Nicholas Brady, male    DOB: 09/17/1950, 63 y.o.   MRN: [id number]  HPI  63 y.o.   white male with advanced chronic obstructive lung disease   11/18/2013 Post Hospital follow up  Patient presents for a post hospital followup. Patient was on vacation in [place] and developed worsening shortness, of breath. He was admitted with a left spontaneous pneumothorax requiring chest tube placement. [facility] network hospital records were reviewed. Initial chest x-ray showed a large left-sided pneumothorax. A chest tube was placed. Patient improved with resolution of pneumothorax with chest tube removal. He was discharged on June 10 . Followup. Chest x-ray on June 17 showed no visible pneumothorax and improved. Subcutaneous emphysema. Chronic left-sided anterior bulla. Since discharge. Patient reports that he is improved with decreased shortness, of breath. Patient says he has some tenderness along the previous chest tube site . On left. Chest wall. He denies any hemoptysis, orthopnea, PND, leg swelling. Chest x-ray today shows no evidence of pneumothorax. Small amount of subcutaneous air. Over the lateral left chest wall.   Past Medical History  Diagnosis Date  . Chronic airway obstruction, not elsewhere classified   . Unspecified essential hypertension   . Allergic rhinitis, cause unspecified   . Esophageal reflux      Family History  Problem Relation Age of Onset  . Emphysema       History   Social History  . Marital Status: Divorced    Spouse Name: N/A    Number of Children: N/A  . Years of Education: N/A   Occupational History  . Disabled    Social History Main Topics  . Smoking status: Former Smoker -- 4.00 packs/day for 45 years    Types: Cigarettes    Quit date: 06/29/2006  . Smokeless tobacco: Never Used  . Alcohol Use: No  . Drug Use: Not on file  . Sexual Activity: Not on file   Other Topics Concern   . Not on file   Social History Narrative  . No narrative on file     No Known Allergies   Outpatient Prescriptions Prior to Visit  Medication Sig Dispense Refill  . albuterol (PROVENTIL) (2.5 MG/3ML) 0.083% nebulizer solution Take 2.5 mg by nebulization every 6 (six) hours as needed for wheezing.  360 mL  5  . alfuzosin (UROXATRAL) 10 MG 24 hr tablet Take 10 mg by mouth daily.        Marland Kitchen. amLODipine-valsartan (EXFORGE) 5-160 MG per tablet Take 1 tablet by mouth daily.      Marland Kitchen. azithromycin (ZITHROMAX) 250 MG tablet Take 2 tabs today then 1 daily until gone  6 each  0  . BREO ELLIPTA 100-25 MCG/INH AEPB take 1 inhalation by mouth once daily  60 each  3  . cyclobenzaprine (FLEXERIL) 10 MG tablet Take 10 mg by mouth 3 (three) times daily as needed.       Marland Kitchen. DALIRESP 500 MCG TABS tablet take 1 tablet by mouth daily  30 tablet  6  . esomeprazole (NEXIUM) 40 MG capsule Take 40 mg by mouth daily before breakfast.        . fluticasone (FLONASE) 50 MCG/ACT nasal spray instill 2 sprays INTO NOSTRIL(S) daily if needed  16 g  6  . gabapentin (NEURONTIN) 300 MG capsule Take 600 mg by mouth 3 (three) times daily.       . montelukast (SINGULAIR) 10 MG tablet Take 10  mg by mouth at bedtime.        Marland Kitchen. oxyCODONE-acetaminophen (PERCOCET) 7.5-325 MG per tablet Take 1 tablet by mouth every 6 (six) hours as needed.        . predniSONE (DELTASONE) 10 MG tablet Take 4 tabs daily x 2 days, 3 tabs daily x 2 days, 2 tabs daily x 2 days, 1 tab daily x 2 days  20 tablet  0  . PROAIR HFA 108 (90 BASE) MCG/ACT inhaler inhale 1 to 2 puffs every 4 to 6 hours if needed  8.5 g  6  . SPIRIVA HANDIHALER 18 MCG inhalation capsule inhale the contents of one capsule in the handihaler once daily  30 capsule  6  . traZODone (DESYREL) 50 MG tablet Take 50 mg by mouth 2 (two) times daily.        Marland Kitchen. zolpidem (AMBIEN) 10 MG tablet Take 10 mg by mouth at bedtime.       No facility-administered medications prior to visit.      Review of  Systems  Constitutional:   No  weight loss, night sweats,  Fevers, chills, fatigue, lassitude. HEENT:   No headaches,  Difficulty swallowing,  Tooth/dental problems,  Sore throat,                No sneezing, itching, ear ache, +nasal congestion, post nasal drip,   CV:  No chest pain,  Orthopnea, PND, swelling in lower extremities, anasarca, dizziness, palpitations  GI  occ heartburn, indigestion, no abdominal pain, nausea, vomiting, diarrhea, change in bowel habits, loss of appetite  Resp: notes  shortness of breath with exertion not  at rest.   No coughing up of blood.    No chest wall deformity  Skin: no rash or lesions.  GU: no dysuria, change in color of urine, no urgency or frequency.  No flank pain.  MS:  No joint pain or swelling.  No decreased range of motion.  No back pain.    Psych:  No change in mood or affect. No depression or anxiety.  No memory loss.     Objective:   Physical Exam There were no vitals taken for this visit.  Gen: Pleasant, thin elderly, in no distress,  normal affect  ENT: No lesions,  mouth clear,  oropharynx clear, no postnasal drip  Neck: No JVD, no TMG, no carotid bruits  Lungs: coarse BS  , diminshed in bases , no wheezes Along lateral chest wall healing scar from previous chest tube site.   Cardiovascular: RRR, heart sounds normal, no murmur or gallops, no peripheral edema  Abdomen: soft and NT, no HSM,  BS normal  Musculoskeletal: No deformities, no cyanosis or clubbing  Neuro: alert, non focal  Skin: Warm, no lesions or rashes     CXR / 11/18/2013  No acute cardiopulmonary disease. No evidence of a pneumothorax.    Assessment & Plan:  No problem-specific assessment & plan notes found for this encounter.

## 2013-11-19 DIAGNOSIS — J9690 Respiratory failure, unspecified, unspecified whether with hypoxia or hypercapnia: Secondary | ICD-10-CM | POA: Insufficient documentation

## 2013-11-24 DIAGNOSIS — I4891 Unspecified atrial fibrillation: Secondary | ICD-10-CM | POA: Insufficient documentation

## 2013-12-09 ENCOUNTER — Ambulatory Visit: Payer: Medicaid Other | Admitting: Adult Health

## 2013-12-11 ENCOUNTER — Telehealth: Payer: Self-pay | Admitting: Critical Care Medicine

## 2013-12-11 NOTE — Telephone Encounter (Signed)
lmtcb x1 

## 2013-12-12 NOTE — Telephone Encounter (Signed)
LMOM x 1 Need to verify pharmacy

## 2013-12-12 NOTE — Telephone Encounter (Signed)
I called spoke with pt. He reports when he was in the hospital [facility] [place] d/t collapsed lung. He wasd/c'd on sy,bicort and feels this has worked well for him. He is asking for RX. PW is not on scheduled. [facility] d/c summary also printed off for review.  Please advise Dr. Maple HudsonYoung thanks

## 2013-12-12 NOTE — Telephone Encounter (Signed)
Ok Symbicort 160  # 1, 2 puffs then rinse mouth, twice daily    refill x 5

## 2013-12-15 MED ORDER — BUDESONIDE-FORMOTEROL FUMARATE 160-4.5 MCG/ACT IN AERO: 2.0000 | INHALATION_SPRAY | Freq: Two times a day (BID) | RESPIRATORY_TRACT | Status: DC

## 2013-12-15 NOTE — Telephone Encounter (Signed)
Symbicort 160 - 2 puff [facility]ID x [facility] [address] Pt aware.

## 2013-12-15 NOTE — Telephone Encounter (Signed)
Ok to change to symbicort 160 two puff bid

## 2013-12-16 ENCOUNTER — Telehealth: Payer: Self-pay | Admitting: Critical Care Medicine

## 2013-12-16 DIAGNOSIS — J441 Chronic obstructive pulmonary disease with (acute) exacerbation: Secondary | ICD-10-CM

## 2013-12-16 NOTE — Telephone Encounter (Signed)
Spoke with Nicholas [facility] They will need OV notes and qualifying sats faxed in order for insurance to cover O2 Last OV 11/18/13 requalifying sats would need to be done and faxed before 12/18/13 (30-day period), otherwise pt will need another OV with Dr Delford FieldWright to renew this.  Pt need nurse visit for qualifying sats 12/17/13  LM for Gaylyn RongKris to contact our office.

## 2013-12-16 NOTE — Telephone Encounter (Signed)
Called made pt aware of below. He is scheduled to come in and have this done tomorrow in [place]. Nothing further needed

## 2013-12-16 NOTE — Telephone Encounter (Signed)
i am ok to issue new order for oxygen , note they will ask him to requalify

## 2013-12-16 NOTE — Telephone Encounter (Signed)
Gaylyn RongKris returned call.

## 2013-12-16 NOTE — Telephone Encounter (Signed)
LM for Nicholas Brady to return call.

## 2013-12-16 NOTE — Telephone Encounter (Signed)
Called spoke with pt. He is wanting [facility]. He uses O2 2l/m prn w/ exertion and prn sleep. He reports his O2 equipment does not to well when he uses it. [facility] to get his equipment he has to be fixed unless we sent an order to get him all new equipment. Please advise Dr. Delford FieldWright thanks

## 2013-12-17 ENCOUNTER — Ambulatory Visit (INDEPENDENT_AMBULATORY_CARE_PROVIDER_SITE_OTHER): Payer: Medicaid Other

## 2013-12-17 ENCOUNTER — Telehealth: Payer: Self-pay | Admitting: Critical Care Medicine

## 2013-12-17 DIAGNOSIS — J441 Chronic obstructive pulmonary disease with (acute) exacerbation: Secondary | ICD-10-CM

## 2013-12-17 NOTE — Telephone Encounter (Signed)
lmomtcb x1 

## 2013-12-17 NOTE — Telephone Encounter (Signed)
[facility] and [facility] [place] [facility] He stated they need the liter flow and frequency of the POC that was ordered. New order placed with the requested information. Nothing further needed.

## 2013-12-17 NOTE — Addendum Note (Signed)
Addended by: Boone MasterJONES, Everlynn Sagun E on: 12/17/2013 11:01 AM   Modules accepted: Orders

## 2013-12-17 NOTE — Telephone Encounter (Signed)
Pt did arrive for O2 re-qualification. Sats dropped to 87% on room air w/ ambulation and also dropped on 2lpm w/ ambulation to 87% Pt placed on 3lpm and sats maintained at 91%.  This has been documented in pt's chart and [facility] best portable O2 system.  Will forward to PW to make him aware about pt's O2 liter flow.

## 2013-12-17 NOTE — Telephone Encounter (Signed)
noted 

## 2013-12-22 ENCOUNTER — Encounter: Payer: Self-pay | Admitting: Critical Care Medicine

## 2013-12-22 ENCOUNTER — Ambulatory Visit (INDEPENDENT_AMBULATORY_CARE_PROVIDER_SITE_OTHER)
Admission: RE | Admit: 2013-12-22 | Discharge: 2013-12-22 | Disposition: A | Discharge status: Home / Self Care | Payer: Medicaid Other | Source: Ambulatory Visit | Attending: Critical Care Medicine | Admitting: Critical Care Medicine

## 2013-12-22 ENCOUNTER — Ambulatory Visit (INDEPENDENT_AMBULATORY_CARE_PROVIDER_SITE_OTHER): Payer: Medicaid Other | Admitting: Critical Care Medicine

## 2013-12-22 VITALS — BP 134/82 | HR 59 | Ht 69.0 in | Wt 168.0 lb

## 2013-12-22 DIAGNOSIS — J432 Centrilobular emphysema: Secondary | ICD-10-CM

## 2013-12-22 DIAGNOSIS — J438 Other emphysema: Secondary | ICD-10-CM

## 2013-12-22 DIAGNOSIS — J439 Emphysema, unspecified: Secondary | ICD-10-CM

## 2013-12-22 DIAGNOSIS — J939 Pneumothorax, unspecified: Secondary | ICD-10-CM

## 2013-12-22 DIAGNOSIS — J9383 Other pneumothorax: Secondary | ICD-10-CM

## 2013-12-22 DIAGNOSIS — J441 Chronic obstructive pulmonary disease with (acute) exacerbation: Secondary | ICD-10-CM

## 2013-12-22 NOTE — Assessment & Plan Note (Signed)
History pneumothorax on left now resolved with pleural surgery

## 2013-12-22 NOTE — Patient Instructions (Signed)
Stop Breo Stay on Symbicort and spiriva Stop Daliresp I will followup on any cultures done at [facility] on your lung Use oxygen 24/7 especially with exertion and at night 3Liters Chest xray today Return 6 weeks

## 2013-12-22 NOTE — Progress Notes (Signed)
Subjective:    Patient ID: Nicholas Brady, male    DOB: 07/31/1950, 63 y.o.   MRN: [id number]  HPI  63 y.o.   white male with advanced chronic obstructive lung disease  .  12/22/2013 Chief Complaint  Patient presents with  . Hospitalization Follow-up    [facility] lung.  Was released July 14th.  Pt c/o SOB with exertion, 02 dropping with exertion, prod cough with clear mucus.    PT with Left VATS 10/2013.  Pt [facility] first and tfr to [facility].  Pt did a pleurodesis on Left  And 6/30>>12/09/13.  afib rvr. D/c on sotalol  80mg  bid On symbicort and off breo.  Symbicort: uses spacer.  Now off daliresp.  Pt still dyspneic with any exertion.  sats will fall to low 80%.  Not sleeping with oxygen Notes some chest pain on Left.  [facility] [place].   Echo: EF 55%.   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 Prior VATS 2008.  "granulomas" MAI??   Review of Systems  Constitutional:   No  weight loss, night sweats,  Fevers, chills, fatigue, lassitude. HEENT:   No headaches,  Difficulty swallowing,  Tooth/dental problems,  Sore throat,                No sneezing, itching, ear ache, +nasal congestion, post nasal drip,   CV:  No chest pain,  Orthopnea, PND, swelling in lower extremities, anasarca, dizziness, palpitations  GI  occ heartburn, indigestion, no abdominal pain, nausea, vomiting, diarrhea, change in bowel habits, loss of appetite  Resp: notes  shortness of breath with exertion not  at rest.   No coughing up of blood.    No chest wall deformity  Skin: no rash or lesions.  GU: no dysuria, change in color of urine, no urgency or frequency.  No flank pain.  MS:  No joint pain or swelling.  No decreased range of motion.  No back pain.    Psych:  No change in mood or affect. No depression or anxiety.  No memory loss.     Objective:   Physical Exam BP 134/82  Pulse 59  Ht 5\' 9"  (1.753 m)  Wt 168 lb (76.204 kg)  BMI 24.80 kg/m2   SpO2 95%  Gen: Pleasant, thin elderly, in no distress,  normal affect  ENT: No lesions,  mouth clear,  oropharynx clear, no postnasal drip  Neck: No JVD, no TMG, no carotid bruits  Lungs: coarse BS  , diminshed in bases , no wheezes Along lateral chest wall healing scar from previous chest tube site.   Cardiovascular: RRR, heart sounds normal, no murmur or gallops, no peripheral edema  Abdomen: soft and NT, no HSM,  BS normal  Musculoskeletal: No deformities, no cyanosis or clubbing  Neuro: alert, non focal  Skin: Warm, no lesions or rashes       Assessment & Plan:  Golds C Copd Frequent exacerbations Gold stage C. 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

## 2013-12-22 NOTE — Assessment & Plan Note (Signed)
Gold stage C. 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

## 2013-12-22 NOTE — Assessment & Plan Note (Signed)
History of bullous emphysema with recurrent pneumothorax on left

## 2013-12-23 NOTE — Progress Notes (Signed)
Quick Note:  Called, spoke with pt. Informed him of cxr results and recs per Dr. Wright. He verbalized understanding and voiced no further questions or concerns at this time. ______ 

## 2013-12-25 ENCOUNTER — Telehealth: Payer: Self-pay | Admitting: Critical Care Medicine

## 2013-12-25 NOTE — Telephone Encounter (Signed)
Rec'd from [facility] forward 2 pages to Dr. Delford FieldWright

## 2014-02-04 ENCOUNTER — Ambulatory Visit (INDEPENDENT_AMBULATORY_CARE_PROVIDER_SITE_OTHER): Payer: Medicaid Other | Admitting: Critical Care Medicine

## 2014-02-04 ENCOUNTER — Encounter: Payer: Self-pay | Admitting: Critical Care Medicine

## 2014-02-04 VITALS — BP 124/72 | HR 62 | Temp 97.0°F | Ht 69.0 in | Wt 176.0 lb

## 2014-02-04 DIAGNOSIS — A310 Pulmonary mycobacterial infection: Secondary | ICD-10-CM

## 2014-02-04 DIAGNOSIS — J441 Chronic obstructive pulmonary disease with (acute) exacerbation: Secondary | ICD-10-CM

## 2014-02-04 DIAGNOSIS — Z23 Encounter for immunization: Secondary | ICD-10-CM

## 2014-02-04 DIAGNOSIS — D71 Functional disorders of polymorphonuclear neutrophils: Secondary | ICD-10-CM | POA: Insufficient documentation

## 2014-02-04 MED ORDER — AZITHROMYCIN 250 MG PO TABS: ORAL_TABLET | ORAL | Status: DC

## 2014-02-04 NOTE — Progress Notes (Signed)
  Subjective:    Patient ID: Nicholas Brady, male    DOB: 1951-03-08, 63 y.o.   MRN: [id number]  HPI  63 y.o.   white male with advanced chronic obstructive lung disease  . 02/04/2014 Chief Complaint  Patient presents with  . 6 wk follow up    Has good days and bad days.  Notices SOB with exertion at times, tightness in upper and left side of chest, and dry cough.  No real changes.  Notes some tightness and pain on L side of ribs.  Cough is dry.  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 No final culture seen   Review of Systems  Constitutional:   No  weight loss, night sweats,  Fevers, chills, fatigue, lassitude. HEENT:   No headaches,  Difficulty swallowing,  Tooth/dental problems,  Sore throat,                No sneezing, itching, ear ache, +nasal congestion, post nasal drip,   CV:  No chest pain,  Orthopnea, PND, swelling in lower extremities, anasarca, dizziness, palpitations  GI  occ heartburn, indigestion, no abdominal pain, nausea, vomiting, diarrhea, change in bowel habits, loss of appetite  Resp: notes  shortness of breath with exertion not  at rest.   No coughing up of blood.    No chest wall deformity  Skin: no rash or lesions.  GU: no dysuria, change in color of urine, no urgency or frequency.  No flank pain.  MS:  No joint pain or swelling.  No decreased range of motion.  No back pain.    Psych:  No change in mood or affect. No depression or anxiety.  No memory loss.     Objective:   Physical Exam BP 124/72  Pulse 62  Temp(Src) 97 F (36.1 C) (Oral)  Ht  (1.753 m)  Wt 176 lb (79.833 kg)  BMI 25.98 kg/m2  SpO2 94%  Gen: Pleasant, thin elderly, in no distress,  normal affect  ENT: No lesions,  mouth clear,  oropharynx clear, no postnasal drip  Neck: No JVD, no TMG, no carotid bruits  Lungs: coarse BS  , diminshed in bases , no wheezes Along lateral chest wall  healing scar from previous chest tube site.   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

## 2014-02-04 NOTE — Patient Instructions (Signed)
No change in medications Prevnar 13 and Flu vaccine was given ZPak Rx given to travel Return 3 months

## 2014-02-05 NOTE — Assessment & Plan Note (Signed)
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

## 2014-04-22 ENCOUNTER — Ambulatory Visit (INDEPENDENT_AMBULATORY_CARE_PROVIDER_SITE_OTHER): Payer: Medicaid Other | Admitting: Critical Care Medicine

## 2014-04-22 ENCOUNTER — Encounter: Payer: Self-pay | Admitting: Critical Care Medicine

## 2014-04-22 VITALS — BP 120/86 | HR 74 | Temp 97.8°F | Ht 69.0 in | Wt 187.2 lb

## 2014-04-22 DIAGNOSIS — J441 Chronic obstructive pulmonary disease with (acute) exacerbation: Secondary | ICD-10-CM

## 2014-04-22 NOTE — Patient Instructions (Signed)
Stop combivent Stay on albuterol as needed Stay on symbicort twice daily Stay on Spiriva daily Take azithromycin 250mg  Take two once then one daily until gone  Use Rx I have already given you Return 3 months

## 2014-04-22 NOTE — Assessment & Plan Note (Signed)
COPD with primary emphysematous component and frequent exacerbations with associated chronic Mycobacterium avium infection airways Plan Stop combivent Stay on albuterol as needed Stay on symbicort twice daily Stay on Spiriva daily Take azithromycin 250mg  Take two once then one daily until gone  Use Rx I have already given you Return 3 months

## 2014-04-22 NOTE — Progress Notes (Signed)
  Subjective:    Patient ID: Nicholas Brady, male    DOB: 06/07/1950, 71Luvenia Starch63 y.o.   MRN: [id number]  HPI 63 y.o.   white male with advanced chronic obstructive lung disease  . 04/22/2014 Chief Complaint  Patient presents with  . Follow-up    SOB pain on left side of chest Coughing productive yellowish  Dyspnea is the same, notes some pain on L side. Ribs will hurt.  Coughing up some yellow mucus.  No blood, except in the AM Pt had hosp in [place] and infection diverticulitis, no prior hx of diverticular disease.  PCP jennifer chapman   Review of Systems Constitutional:   No  weight loss, night sweats,  Fevers, chills, fatigue, lassitude. HEENT:   No headaches,  Difficulty swallowing,  Tooth/dental problems,  Sore throat,                No sneezing, itching, ear ache, +nasal congestion, post nasal drip,   CV:  No chest pain,  Orthopnea, PND, swelling in lower extremities, anasarca, dizziness, palpitations  GI  occ heartburn, indigestion, no abdominal pain, nausea, vomiting, diarrhea, change in bowel habits, loss of appetite  Resp: notes  shortness of breath with exertion not  at rest.   No coughing up of blood.    No chest wall deformity  Skin: no rash or lesions.  GU: no dysuria, change in color of urine, no urgency or frequency.  No flank pain.  MS:  No joint pain or swelling.  No decreased range of motion.  No back pain.    Psych:  No change in mood or affect. No depression or anxiety.  No memory loss.     Objective:   Physical ExamBP 120/86 mmHg  Pulse 74  Temp(Src) 97.8 F (36.6 C)  Ht 5\' 9"  (1.753 m)  Wt 187 lb 3.2 oz (84.913 kg)  BMI 27.63 kg/m2  SpO2 95%  Gen: Pleasant, thin elderly, in no distress,  normal affect  ENT: No lesions,  mouth clear,  oropharynx clear, no postnasal drip  Neck: No JVD, no TMG, no carotid bruits  Lungs: coarse BS  , diminshed in bases , no wheezes  Cardiovascular: RRR, heart sounds normal, no murmur or gallops, no peripheral  edema  Abdomen: soft and NT, no HSM,  BS normal  Musculoskeletal: No deformities, no cyanosis or clubbing  Neuro: alert, non focal  Skin: Warm, no lesions or rashes       Assessment & Plan:  Golds C Copd Frequent exacerbations COPD with primary emphysematous component and frequent exacerbations with associated chronic Mycobacterium avium infection airways Plan Stop combivent Stay on albuterol as needed Stay on symbicort twice daily Stay on Spiriva daily Take azithromycin 250mg  Take two once then one daily until gone  Use Rx I have already given you Return 3 months

## 2014-06-01 ENCOUNTER — Telehealth: Payer: Self-pay | Admitting: Critical Care Medicine

## 2014-06-01 MED ORDER — LEVOFLOXACIN 500 MG PO TABS: 500.0000 mg | ORAL_TABLET | Freq: Every day | ORAL | Status: DC

## 2014-06-01 MED ORDER — PREDNISONE 10 MG PO TABS: ORAL_TABLET | ORAL | Status: DC

## 2014-06-01 NOTE — Telephone Encounter (Signed)
Take levaquin  daily x 7days #7 Take prednisone  Take 4 for three days 3 for three days 2 for three days 1 for three days and stop #30

## 2014-06-01 NOTE — Telephone Encounter (Signed)
Called and spoke to pt. Pt c/o recent prod cough with white and grey mucus and increase in SOB, pt also stated he has chest tightness when SOB and when coughing and also has had left sided rib soreness that is not new. Pt stated is going to [place] for an unknown period of time and would like an abx to have on hand if needed. Pt denies f/c/s, PND, orthopnea or swelling.   PW please advise on abx and other recs for SOB and prod cough.   No Known Allergies

## 2014-06-01 NOTE — Telephone Encounter (Signed)
Called, spoke with pt.  Discussed below per Dr. Delford Field.  Pt verbalized understanding of instructions, is aware rxs sent to [facility], and is to call office back or seek emergency care if needed if symptoms do not improve or worsen.

## 2014-07-14 ENCOUNTER — Other Ambulatory Visit: Payer: Self-pay | Admitting: Critical Care Medicine

## 2014-11-02 ENCOUNTER — Other Ambulatory Visit: Payer: Self-pay | Admitting: Critical Care Medicine

## 2014-12-09 ENCOUNTER — Encounter: Payer: Self-pay | Admitting: Critical Care Medicine

## 2014-12-09 ENCOUNTER — Ambulatory Visit (INDEPENDENT_AMBULATORY_CARE_PROVIDER_SITE_OTHER): Payer: Medicaid Other | Admitting: Critical Care Medicine

## 2014-12-09 VITALS — BP 116/74 | HR 70 | Temp 98.2°F | Ht 69.0 in | Wt 197.6 lb

## 2014-12-09 DIAGNOSIS — J441 Chronic obstructive pulmonary disease with (acute) exacerbation: Secondary | ICD-10-CM

## 2014-12-09 MED ORDER — BUDESONIDE-FORMOTEROL FUMARATE 160-4.5 MCG/ACT IN AERO: 2.0000 | INHALATION_SPRAY | Freq: Two times a day (BID) | RESPIRATORY_TRACT | Status: AC

## 2014-12-09 MED ORDER — MONTELUKAST SODIUM 10 MG PO TABS: 10.0000 mg | ORAL_TABLET | Freq: Every day | ORAL | Status: AC

## 2014-12-09 MED ORDER — FLUTICASONE PROPIONATE 50 MCG/ACT NA SUSP: NASAL | Status: AC

## 2014-12-09 MED ORDER — PREDNISONE 10 MG PO TABS: ORAL_TABLET | ORAL | Status: AC

## 2014-12-09 MED ORDER — AZITHROMYCIN 250 MG PO TABS: ORAL_TABLET | ORAL | Status: AC

## 2014-12-09 MED ORDER — TIOTROPIUM BROMIDE MONOHYDRATE 18 MCG IN CAPS: ORAL_CAPSULE | RESPIRATORY_TRACT | Status: AC

## 2014-12-09 NOTE — Progress Notes (Signed)
Subjective:    Patient ID: Nicholas Brady, male    DOB: [birth year], 64 y.o.   MRN: [id number]  HPI 12/09/2014 Chief Complaint  Patient presents with  . Follow-up    patient here for his yearly oxygen check.  Will be going back to [place] next week, wants antibiotic and prednisone to take with him.      Pt notes with dyspnea gets anxious and worsens.  Not much cough.  Last few days notes some throat congestion, hoarsness.  No real discolored mucus.  No real wheezing  Pt denies any significant sore throat, nasal congestion or excess secretions, fever, chills, sweats, unintended weight loss, pleurtic or exertional chest pain, orthopnea PND, or leg swelling Pt denies any increase in rescue therapy over baseline, denies waking up needing it or having any early am or nocturnal exacerbations of coughing/wheezing/or dyspnea. Pt also denies any obvious fluctuation in symptoms with  weather or environmental change or other alleviating or aggravating factors Pt will sleep at night on 3L oxygen    Current Medications, Allergies, Complete Past Medical History, Past Surgical History, Family History, and Social History were reviewed in [employer] [facility] electronic medical record per todays encounter:  12/09/2014    Review of Systems  Constitutional: Negative.   HENT: Negative.  Negative for ear pain, postnasal drip, rhinorrhea, sinus pressure, sore throat, trouble swallowing and voice change.   Eyes: Negative.   Respiratory: Positive for cough and shortness of breath. Negative for apnea, choking, chest tightness, wheezing and stridor.   Cardiovascular: Positive for chest pain. Negative for palpitations and leg swelling.  Gastrointestinal: Negative.  Negative for nausea, vomiting, abdominal pain and abdominal distention.  Genitourinary: Negative.   Musculoskeletal: Negative.  Negative for myalgias and arthralgias.  Skin: Negative.  Negative for rash.  Allergic/Immunologic: Negative.  Negative for  environmental allergies and food allergies.  Neurological: Negative.  Negative for dizziness, syncope, weakness and headaches.  Hematological: Negative.  Negative for adenopathy. Does not bruise/bleed easily.  Psychiatric/Behavioral: Negative.  Negative for sleep disturbance and agitation. The patient is not nervous/anxious.        Objective:   Physical Exam Filed Vitals:   12/09/14 1123  BP: 116/74  Pulse: 70  Temp: 98.2 F (36.8 C)  TempSrc: Oral  Height:  (1.753 m)  Weight: 197 lb 9.6 oz (89.631 kg)  SpO2: 97%    Gen: Pleasant, well-nourished, in no distress,  normal affect  ENT: No lesions,  mouth clear,  oropharynx clear, no postnasal drip  Neck: No JVD, no TMG, no carotid bruits  Lungs: No use of accessory muscles, no dullness to percussion, distant bs  Cardiovascular: RRR, heart sounds normal, no murmur or gallops, no peripheral edema  Abdomen: soft and NT, no HSM,  BS normal  Musculoskeletal: No deformities, no cyanosis or clubbing  Neuro: alert, non focal  Skin: Warm, no lesions or rashes  No results found.  Exertional amb sats 91% RA in office.  Up and down stairs SATs 85%  ONO pending        Assessment & Plan:  I personally reviewed all images and lab data in the [facility] system as well as any outside material available during this office visit and agree with the  radiology impressions.   No problem-specific assessment & plan notes found for this encounter.   Nicholas Brady was seen today for follow-up.  Diagnoses and all orders for this visit:  Golds C Copd Frequent exacerbations Orders: -     Pulse  oximetry, overnight; Future  Other orders -     budesonide-formoterol (SYMBICORT) 160-4.5 MCG/ACT inhaler; Inhale 2 puffs into the lungs 2 (two) times daily. -     tiotropium (SPIRIVA HANDIHALER) 18 MCG inhalation capsule; inhale contents of 1 capsule by mouth once daily -     montelukast (SINGULAIR) 10 MG tablet; Take 1 tablet (10 mg total) by mouth at  bedtime. -     fluticasone (FLONASE) 50 MCG/ACT nasal spray; instill 2 sprays into each nostril once daily if needed -     azithromycin (ZITHROMAX) 250 MG tablet; Take two once then one daily until gone -     predniSONE (DELTASONE) 10 MG tablet; Take 4 for two days three for two days two for two days one for two days

## 2014-12-09 NOTE — Patient Instructions (Signed)
Prednisone and azithromycin sent to pharmacy An overnight oxygen test will be obtained Refills on other meds sent to pharmacy Return 4 months

## 2014-12-10 NOTE — Assessment & Plan Note (Addendum)
Gold C Copd with freq exacerbations Plan Refilled meds No changes recert O2 with ono on RA, Note amb sats 85% up stairs

## 2014-12-18 ENCOUNTER — Telehealth: Payer: Self-pay | Admitting: Critical Care Medicine

## 2014-12-18 NOTE — Telephone Encounter (Signed)
I called spoke with pt. He is requesting his ONO done on wednesday. Aware PW not in office until Monday. Will forward to CJ to ensure results were received. Please advise thanks

## 2014-12-21 NOTE — Telephone Encounter (Signed)
I called made pt aware we are awaiting results.

## 2014-12-21 NOTE — Telephone Encounter (Signed)
I have not seen these results yet. [email or web address] for [place], [phone], with [facility] to fax results to front fax.

## 2014-12-21 NOTE — Telephone Encounter (Signed)
Pt calling again to check on the status of his test results and can be reached @ [phone].Caren Griffins

## 2014-12-22 NOTE — Telephone Encounter (Signed)
ONO on RA results from 12/16/14 received from [facility] and placed in PW's folder for review.

## 2014-12-22 NOTE — Telephone Encounter (Signed)
Results have been explained to patient, pt expressed understanding. Nothing further needed.  

## 2014-12-22 NOTE — Telephone Encounter (Signed)
ONO on RA NORMAL. Therefore he no longer needs oxygen at night

## 2015-01-20 ENCOUNTER — Encounter: Payer: Self-pay | Admitting: Critical Care Medicine

## 2015-02-09 ENCOUNTER — Other Ambulatory Visit: Payer: Self-pay | Admitting: Critical Care Medicine

## 2015-03-24 ENCOUNTER — Telehealth: Payer: Self-pay | Admitting: Emergency Medicine

## 2015-03-24 NOTE — Telephone Encounter (Signed)
Called and spoke with pt. Informed him of RA's recs. Pt stated that he was not wanting to go to UC or ER due to no insurance. He stated that he would wait and see how he felt in the next few hours and if not better he would go to ER if needed. Pt had no further questions and voiced understanding. Nothing further needed. Will sign off on message.

## 2015-03-24 NOTE — Telephone Encounter (Signed)
Spoke with the pt  He c/o increased SOB, cough with min yellow sputum, chest tightness and left side pain--onset "a good while now"-2-3 wks approx  He describes left side pain as "feels like I have been beaten"- sometimes pain 8-9 on pain scale  He was seen last here by PW in July 2016, and las cxr done July 2015 which showed small left effusion  He is in [place] and can not come in any time this wk  He states that he wants us to call in rx "just in case it gets worse"  I explained that the best advise we can give him is to go and be evaluated at an ED closest to him  He refuses, stating, "they will just put me in the hospital" I advised will ask doc on call about rx, but again, best advise is seek emergent care  RA, please advise, thanks! No Known Allergies

## 2015-03-24 NOTE — Telephone Encounter (Signed)
This does not sound like a regular COPD flare Prefer if he gets evaluated at an UC

## 2015-04-16 ENCOUNTER — Ambulatory Visit: Payer: Medicaid Other | Admitting: Emergency Medicine

## 2015-04-20 ENCOUNTER — Telehealth: Payer: Self-pay | Admitting: Emergency Medicine

## 2015-04-20 NOTE — Telephone Encounter (Signed)
Received call from Dr. Arlys JohnBrian Timko's office requesting records to be faxed. Records printed and faxed to number provided. Nothing further needed. Closing encounter

## 2016-06-25 IMAGING — CR DG CHEST 2V
2 series · 2 of 2 positions shown · non-contrast
Comparison: Chest radiograph 11/18/2013, 02/11/2013, 05/11/2008.
Chest CT 05/11/2008

CLINICAL DATA: COPD.  Status post VATS on left.

EXAM:
CHEST  2 VIEW

[view not recorded (1 of 2)]
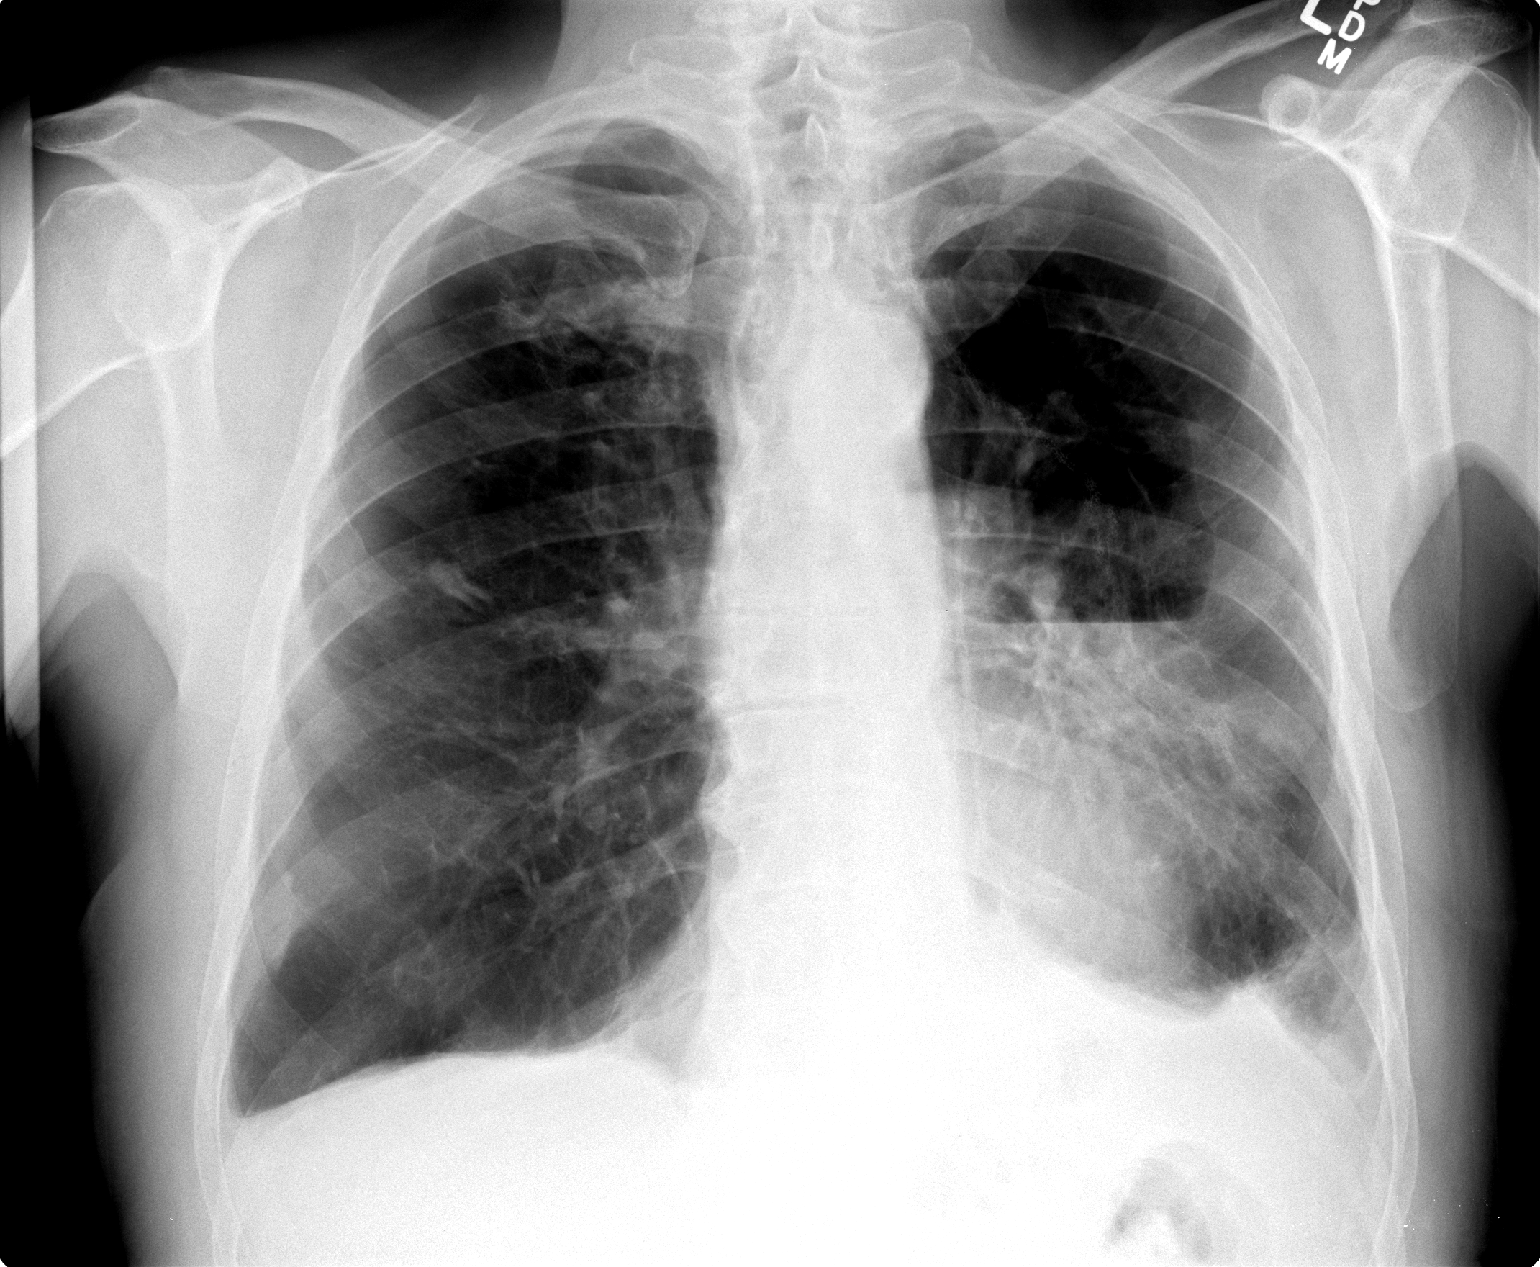

[view not recorded (2 of 2)]
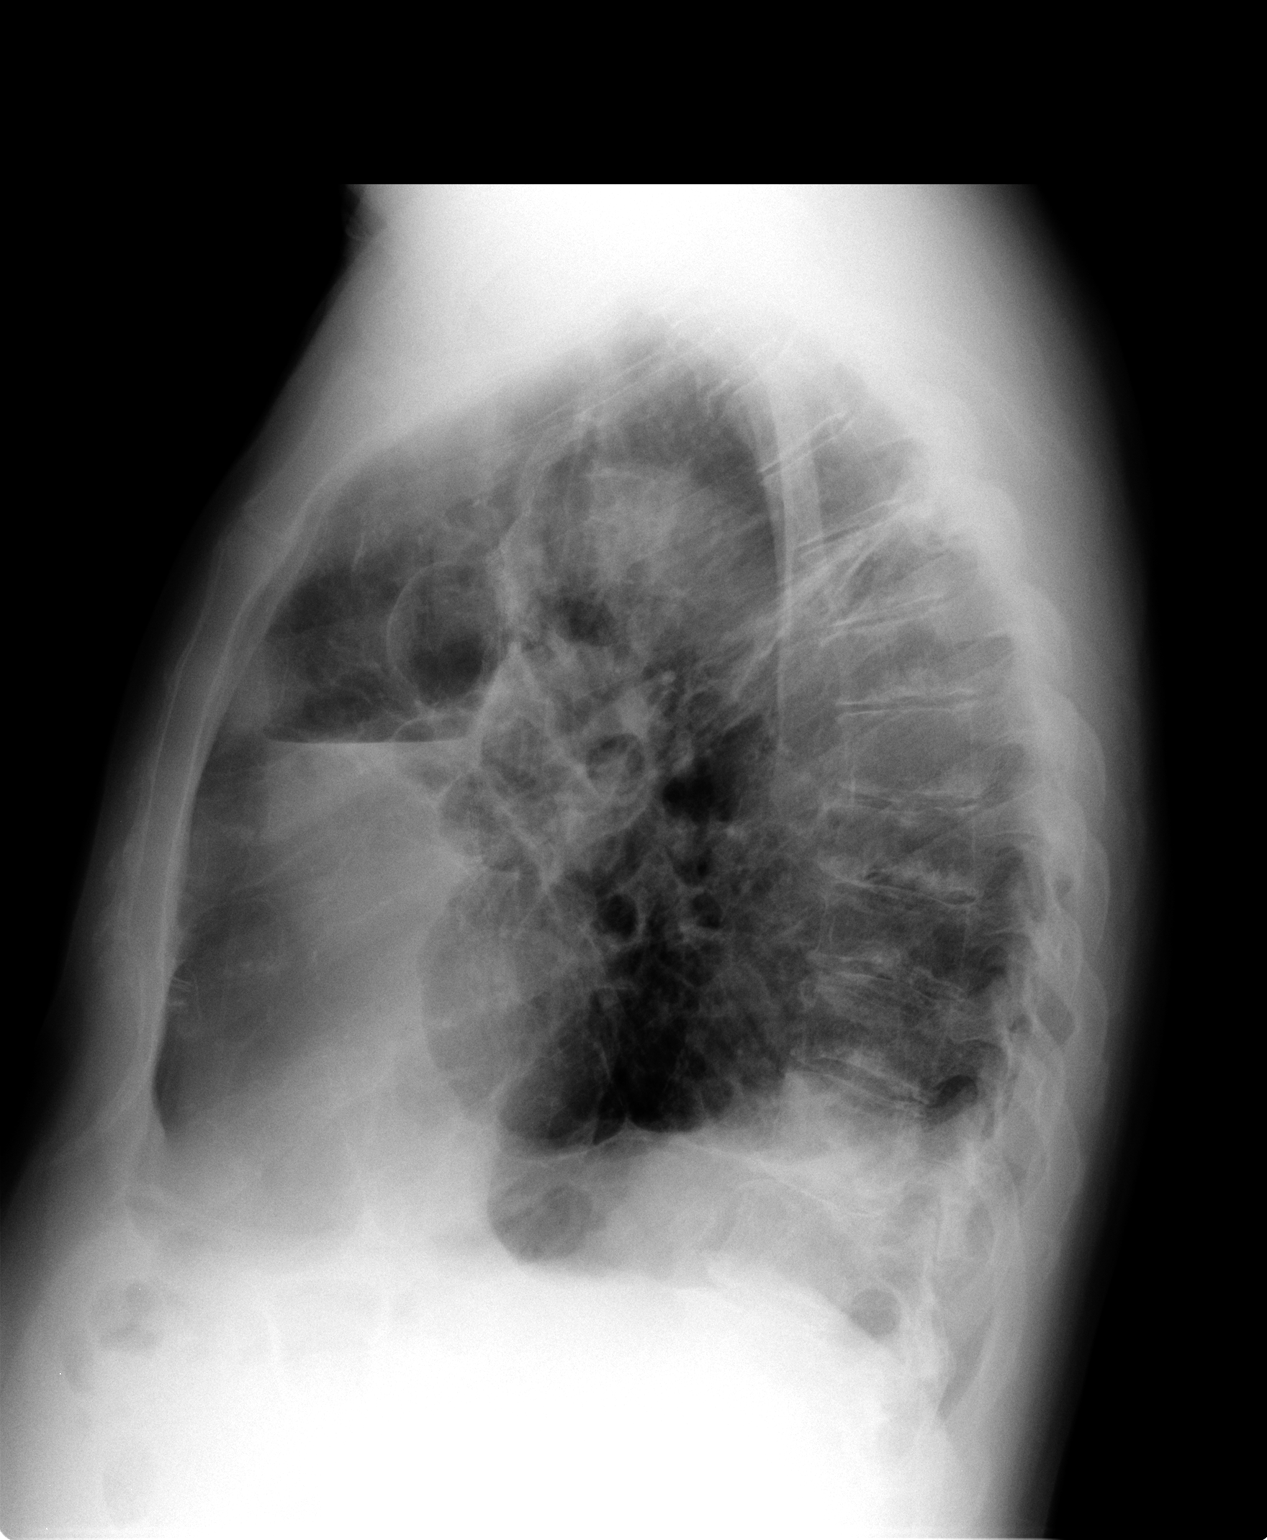

[2 of 2 positions shown; findings below may reference images not displayed]

FINDINGS: The heart size and mediastinal contours are within normal limits.
Chronic emphysematous changes. Postoperative changes in the right
lung. Chronic nodular density in the suprahilar region on the right
unchanged dating back to at least the chest CT a of 05/11/2008,
benign, and possibly postoperative. Chronic scarring the posterior
upper right lung.

The patient has chronic bullous changes in the anterior left upper
lobe. There is an air-fluid level, that is new compared to prior
chest radiograph of 11/18/2013. A small left pleural effusion is
new. Negative for pneumothorax.

Degenerative changes of the thoracic spine.
IMPRESSION: New small left pleural effusion. There is new air-fluid level 10 the
patient's chronic anterior upper lobe bulla.

Stable postoperative changes of the right lung and stable chronic
nodule in the suprahilar right lung.
